# Patient Record
Sex: Female | Born: 1970
Health system: Southern US, Community
[De-identification: ages and names within clinical notes are randomized; demographics above are authoritative.]

## PROBLEM LIST (undated history)

## (undated) DIAGNOSIS — R51 Headache: Secondary | ICD-10-CM

## (undated) DIAGNOSIS — G8929 Other chronic pain: Secondary | ICD-10-CM

## (undated) DIAGNOSIS — T7840XA Allergy, unspecified, initial encounter: Secondary | ICD-10-CM

## (undated) DIAGNOSIS — M1990 Unspecified osteoarthritis, unspecified site: Secondary | ICD-10-CM

## (undated) DIAGNOSIS — R519 Headache, unspecified: Secondary | ICD-10-CM

## (undated) HISTORY — DX: Headache: R51

## (undated) HISTORY — PX: MYOMECTOMY: SHX85

## (undated) HISTORY — DX: Allergy, unspecified, initial encounter: T78.40XA

## (undated) HISTORY — DX: Other chronic pain: G89.29

## (undated) HISTORY — DX: Headache, unspecified: R51.9

## (undated) HISTORY — DX: Unspecified osteoarthritis, unspecified site: M19.90

---

## 2000-05-15 ENCOUNTER — Other Ambulatory Visit: Admission: RE | Admit: 2000-05-15 | Discharge: 2000-05-15 | Payer: Self-pay | Admitting: Obstetrics and Gynecology

## 2003-06-09 ENCOUNTER — Ambulatory Visit (HOSPITAL_COMMUNITY): Admission: RE | Admit: 2003-06-09 | Discharge: 2003-06-09 | Payer: Self-pay | Admitting: Family Medicine

## 2003-07-28 ENCOUNTER — Other Ambulatory Visit: Admission: RE | Admit: 2003-07-28 | Discharge: 2003-07-28 | Payer: Self-pay | Admitting: Obstetrics and Gynecology

## 2004-07-28 ENCOUNTER — Other Ambulatory Visit: Admission: RE | Admit: 2004-07-28 | Discharge: 2004-07-28 | Payer: Self-pay | Admitting: Obstetrics and Gynecology

## 2004-08-24 ENCOUNTER — Ambulatory Visit (HOSPITAL_COMMUNITY): Admission: RE | Admit: 2004-08-24 | Discharge: 2004-08-24 | Payer: Self-pay | Admitting: Obstetrics and Gynecology

## 2007-02-07 ENCOUNTER — Encounter (INDEPENDENT_AMBULATORY_CARE_PROVIDER_SITE_OTHER): Payer: Self-pay | Admitting: Obstetrics and Gynecology

## 2007-02-07 ENCOUNTER — Inpatient Hospital Stay (HOSPITAL_COMMUNITY): Admission: RE | Admit: 2007-02-07 | Discharge: 2007-02-09 | Payer: Self-pay | Admitting: Obstetrics and Gynecology

## 2008-12-16 ENCOUNTER — Ambulatory Visit (HOSPITAL_COMMUNITY): Admission: RE | Admit: 2008-12-16 | Discharge: 2008-12-16 | Payer: Self-pay | Admitting: Obstetrics and Gynecology

## 2008-12-30 ENCOUNTER — Ambulatory Visit (HOSPITAL_COMMUNITY): Admission: RE | Admit: 2008-12-30 | Discharge: 2008-12-30 | Payer: Self-pay | Admitting: Obstetrics and Gynecology

## 2009-01-15 ENCOUNTER — Ambulatory Visit (HOSPITAL_COMMUNITY): Admission: RE | Admit: 2009-01-15 | Discharge: 2009-01-15 | Payer: Self-pay | Admitting: Obstetrics and Gynecology

## 2009-01-30 ENCOUNTER — Ambulatory Visit (HOSPITAL_COMMUNITY): Admission: RE | Admit: 2009-01-30 | Discharge: 2009-01-30 | Payer: Self-pay | Admitting: Obstetrics and Gynecology

## 2009-02-13 ENCOUNTER — Ambulatory Visit (HOSPITAL_COMMUNITY): Admission: RE | Admit: 2009-02-13 | Discharge: 2009-02-13 | Payer: Self-pay | Admitting: Obstetrics and Gynecology

## 2009-02-26 ENCOUNTER — Ambulatory Visit (HOSPITAL_COMMUNITY): Admission: RE | Admit: 2009-02-26 | Discharge: 2009-02-26 | Payer: Self-pay | Admitting: Obstetrics and Gynecology

## 2009-03-31 ENCOUNTER — Ambulatory Visit: Payer: Self-pay | Admitting: Obstetrics & Gynecology

## 2009-04-07 ENCOUNTER — Inpatient Hospital Stay (HOSPITAL_COMMUNITY): Admission: AD | Admit: 2009-04-07 | Discharge: 2009-04-10 | Payer: Self-pay | Admitting: Obstetrics and Gynecology

## 2009-04-13 ENCOUNTER — Ambulatory Visit (HOSPITAL_COMMUNITY): Admission: RE | Admit: 2009-04-13 | Discharge: 2009-04-13 | Payer: Self-pay | Admitting: Obstetrics and Gynecology

## 2009-04-14 ENCOUNTER — Ambulatory Visit: Payer: Self-pay | Admitting: Obstetrics & Gynecology

## 2009-04-17 ENCOUNTER — Ambulatory Visit: Payer: Self-pay | Admitting: Obstetrics & Gynecology

## 2009-04-21 ENCOUNTER — Ambulatory Visit: Payer: Self-pay | Admitting: Obstetrics & Gynecology

## 2009-04-23 ENCOUNTER — Ambulatory Visit: Payer: Self-pay | Admitting: Obstetrics & Gynecology

## 2009-04-28 ENCOUNTER — Encounter (INDEPENDENT_AMBULATORY_CARE_PROVIDER_SITE_OTHER): Payer: Self-pay | Admitting: Obstetrics and Gynecology

## 2009-04-28 ENCOUNTER — Ambulatory Visit: Payer: Self-pay | Admitting: Obstetrics & Gynecology

## 2009-04-28 ENCOUNTER — Inpatient Hospital Stay (HOSPITAL_COMMUNITY): Admission: AD | Admit: 2009-04-28 | Discharge: 2009-05-01 | Payer: Self-pay | Admitting: Obstetrics and Gynecology

## 2010-05-23 ENCOUNTER — Encounter: Payer: Self-pay | Admitting: Obstetrics and Gynecology

## 2010-08-02 LAB — COMPREHENSIVE METABOLIC PANEL
ALT: 13 U/L (ref 0–35)
AST: 22 U/L (ref 0–37)
AST: 34 U/L (ref 0–37)
Albumin: 2.1 g/dL — ABNORMAL LOW (ref 3.5–5.2)
Albumin: 2.7 g/dL — ABNORMAL LOW (ref 3.5–5.2)
BUN: 2 mg/dL — ABNORMAL LOW (ref 6–23)
Calcium: 7.3 mg/dL — ABNORMAL LOW (ref 8.4–10.5)
Calcium: 9.1 mg/dL (ref 8.4–10.5)
Chloride: 103 mEq/L (ref 96–112)
Chloride: 107 mEq/L (ref 96–112)
Creatinine, Ser: 0.7 mg/dL (ref 0.4–1.2)
Creatinine, Ser: 0.87 mg/dL (ref 0.4–1.2)
GFR calc Af Amer: >60 mL/min (ref >60)
GFR calc Af Amer: >60 mL/min (ref >60)
Sodium: 136 mEq/L (ref 135–145)
Total Bilirubin: 0.2 mg/dL — ABNORMAL LOW (ref 0.3–1.2)
Total Bilirubin: 0.7 mg/dL (ref 0.3–1.2)
Total Protein: 5 g/dL — ABNORMAL LOW (ref 6.0–8.3)

## 2010-08-02 LAB — DIFFERENTIAL
Basophils Absolute: 0 10*3/uL (ref 0.0–0.1)
Eosinophils Relative: 1 % (ref 0–5)
Lymphocytes Relative: 19 % (ref 12–46)
Lymphs Abs: 1.5 10*3/uL (ref 0.7–4.0)
Monocytes Absolute: 0.6 10*3/uL (ref 0.1–1.0)
Monocytes Relative: 8 % (ref 3–12)
Neutro Abs: 6 10*3/uL (ref 1.7–7.7)

## 2010-08-02 LAB — URINE CULTURE: Special Requests: NEGATIVE

## 2010-08-02 LAB — CBC
HCT: 36.8 % (ref 36.0–46.0)
Hemoglobin: 13.6 g/dL (ref 12.0–15.0)
MCHC: 33.2 g/dL (ref 30.0–36.0)
MCHC: 33.5 g/dL (ref 30.0–36.0)
MCV: 85.9 fL (ref 78.0–100.0)
MCV: 86 fL (ref 78.0–100.0)
MCV: 86.8 fL (ref 78.0–100.0)
Platelets: 145 10*3/uL — ABNORMAL LOW (ref 150–400)
Platelets: 154 10*3/uL (ref 150–400)
RBC: 4.53 MIL/uL (ref 3.87–5.11)
RBC: 4.73 MIL/uL (ref 3.87–5.11)
RDW: 14.9 % (ref 11.5–15.5)
RDW: 15.1 % (ref 11.5–15.5)
WBC: 5.2 10*3/uL (ref 4.0–10.5)
WBC: 8.2 10*3/uL (ref 4.0–10.5)

## 2010-08-02 LAB — URINALYSIS, ROUTINE W REFLEX MICROSCOPIC
Bilirubin Urine: NEGATIVE
Glucose, UA: NEGATIVE mg/dL
Ketones, ur: 15 mg/dL — AB
Protein, ur: NEGATIVE mg/dL

## 2010-08-02 LAB — URIC ACID: Uric Acid, Serum: 7.6 mg/dL — ABNORMAL HIGH (ref 2.4–7.0)

## 2010-08-03 LAB — CBC
HCT: 34 % — ABNORMAL LOW (ref 36.0–46.0)
Platelets: 201 10*3/uL (ref 150–400)
RDW: 14.4 % (ref 11.5–15.5)
WBC: 4.7 10*3/uL (ref 4.0–10.5)

## 2010-08-03 LAB — COMPREHENSIVE METABOLIC PANEL
ALT: 13 U/L (ref 0–35)
Albumin: 2.5 g/dL — ABNORMAL LOW (ref 3.5–5.2)
BUN: 7 mg/dL (ref 6–23)
Calcium: 8.9 mg/dL (ref 8.4–10.5)
Glucose, Bld: 91 mg/dL (ref 70–99)
Sodium: 137 mEq/L (ref 135–145)
Total Protein: 5.2 g/dL — ABNORMAL LOW (ref 6.0–8.3)

## 2010-08-03 LAB — RPR: RPR Ser Ql: NONREACTIVE

## 2010-08-03 LAB — LACTATE DEHYDROGENASE: LDH: 141 U/L (ref 94–250)

## 2010-08-03 LAB — URIC ACID: Uric Acid, Serum: 6.3 mg/dL (ref 2.4–7.0)

## 2010-08-03 LAB — STREP B DNA PROBE: Strep Group B Ag: NEGATIVE

## 2010-09-14 NOTE — Op Note (Signed)
Shirley Wallace, Shirley Wallace                  ACCOUNT NO.:  [id number]   MEDICAL RECORD NO.:  [id number]          PATIENT TYPE:  INP   LOCATION:  9319                          FACILITY:  [facility]   PHYSICIAN:  Maxie Better, M.D.DATE OF BIRTH:  Sep 06, 1970   DATE OF PROCEDURE:  02/07/2007  DATE OF DISCHARGE:                               OPERATIVE REPORT   PREOPERATIVE DIAGNOSIS:  Menorrhagia, fibroid uterus, recurrent  pregnancy loss.   PROCEDURES:  Exploratory laparotomy, multiple myomectomy.   POSTOPERATIVE DIAGNOSIS:  Recurrent pregnancy loss, fibroid uterus,  menorrhagia.   ANESTHESIA:  General.   SURGEON:  Maxie Better, M.D.   ASSISTANT:  Cordelia Pen A. Rosalio Macadamia, M.D.   INDICATIONS:  This is an 40 year old gravida 4, para 2-0-2-2 female with  symptomatic uterine fibroids and recurrent pregnancy loss and now  presents for surgical management.  Risk and benefits of procedure have  been explained to the patient and her family.  Consent was signed.  The  patient was transferred to the operating room.   PROCEDURE:  Under adequate general anesthesia, the patient is placed in  supine position.  She was sterilely prepped and draped in usual fashion.  An indwelling Foley catheter was sterilely placed.  0.25% Marcaine was  injected along the planned Pfannenstiel skin incision site.  Pfannenstiel skin incision was then made, carried down to the rectus  fascia.  Rectus fascia was opened transversely.  Rectus fascia was then  bluntly and sharply dissected off the rectus muscle in superior and  inferior fashion.  The rectus muscles split in midline.  The parietal  peritoneum was entered sharply and extended.  The uterus was  exteriorized.  Multiple fibroids were noted. One left anterior  pedunculated fibroid was noted, another several subserosal and  intramural fibroids was found.  There were some filmy adhesions along  the posterior aspect of the uterus and involving both fallopian tubes  proximally, both ovaries otherwise were normal.  The distal end of the  fallopian tubes appeared normal.  Using a dilute solution of Pitressin,  injections were made over the line of the fibroid to maximize the amount  of fibroid that could be removed with minimal incisions.  Both  posteriorly and anteriorly incisions were made with resultant removal of  13 fibroids, one of which was clearly submucosal which resulted in entry  into the endometrial cavity.  The defects from the removal of the  fibroids were closed with 0 Vicryl figure-of-eight sutures until serosal  surface was reached at which point either 2-0 Monocryl baseball stitch  was used, 3-0 Monocryl used to facilitate closures of all incision.  The  endometrial cavity was palpated.  No other fibroids were noted.  With  the removal of the fibroids, the abdomen was copiously irrigated and  suctioned.  The appendix was noted to be elongated but otherwise normal.  The upper abdomen was not palpated to minimize the manipulation of the  abdomen and decrease adhesion formation.  The uterus was returned to the  abdomen.  Interceed was placed anteriorly and posteriorly overlying the  incisions.  Good hemostasis was  noted.  The parietal peritoneum was not  closed.  The rectus fascia was closed with 0 Vicryl x2.  The  subcutaneous areas irrigated, small bleeders cauterized and interrupted  2-0 plain suture was used in the subcutaneous space.  The skin  approximated using Ethicon staples.  Specimens were myoma x 13 sent to  pathology.  Estimated blood loss was less than 100 mL.  Intraoperative  fluid was 2300 mL crystalloid.  Urine output was 450 mL clear yellow  urine.  Sponge and instrument counts x2 was correct.   COMPLICATIONS:  None.  The endometrial cavity is entered and therefore  this patient will need cesarean section for future deliveries.  The  patient tolerated the procedure well, was transferred to recovery in  stable  condition.      Maxie Better, M.D.  Electronically Signed     [place]/MEDQ  D:  02/07/2007  T:  02/07/2007  Job:  [id number]

## 2010-09-17 NOTE — Discharge Summary (Signed)
Shirley Wallace, Shirley Wallace                  ACCOUNT NO.:  [id number]   MEDICAL RECORD NO.:  [id number]          PATIENT TYPE:  INP   LOCATION:  9319                          FACILITY:  [facility]   PHYSICIAN:  Maxie Better, M.D.DATE OF BIRTH:  25-Sep-1970   DATE OF ADMISSION:  02/07/2007  DATE OF DISCHARGE:  02/09/2007                               DISCHARGE SUMMARY   ADMISSION DIAGNOSES:  1. Recurrent pregnancy loss.  2. Menorrhagia.  3. Uterine fibroids.   DISCHARGE DIAGNOSES:  1. Recurrent pregnancy loss.  2. Menorrhagia.  3. Uterine fibroids.   PROCEDURE:  Exploratory laparotomy, multiple myomectomies.   HISTORY OF PRESENT ILLNESS:  A 40 year old, gravida 4 para 0-0-4-0  female with symptomatic uterine fibroids and recurrent pregnancy loss  who now presents for surgical management.   HOSPITAL COURSE:  The patient was admitted to [facility].  She  underwent exploratory laparotomy and removal of 13 myomas.  She had  normal tubes with some paratubal adhesions which were lysed, normal  ovaries, normal appendix.  Endometrial cavity was entered and therefore  this patient will require cesarean section for future delivery.  Her  postoperative course was unremarkable.  Her CBC on postop day #1 was  10.2, hematocrit 30, white count of 14.7, platelet count of 365,000.  She was tolerating a regular diet, had passed flatus.  Her incision had  no evidence of infection.  She was deemed well to be discharged.  Her  pathology revealed 446 grams of fibroids.   DISPOSITION:  Home.   CONDITION:  Stable.   DISCHARGE MEDICATIONS:  1. Darvocet-N 100 one to two tablets q.3-4 hours pain.  2. Continue her iron supplementation one p.o. daily.  3. Multivitamins one p.o. daily.   FOLLOWUP APPOINTMENT:  [facility] for staple removal on Monday and  6 weeks postop.   DISCHARGE INSTRUCTIONS:  Call for temperature greater than or equal to  100.4.  Nothing per vagina for 4 to 6 weeks.  No heavy  lifting or  driving for 2 weeks.  No straining with bowel movement.  May shower.  Call for incisional drainage, redness or increased pain from the  incision site, soaking a regular pad every hour or more frequently.      Maxie Better, M.D.  Electronically Signed     [place]/MEDQ  D:  03/27/2007  T:  03/27/2007  Job:  [id number]

## 2011-02-10 LAB — CBC
MCHC: 33.8
Platelets: 365
Platelets: 429 — ABNORMAL HIGH
RDW: 13.7
WBC: 14.7 — ABNORMAL HIGH

## 2011-02-10 LAB — HCG, SERUM, QUALITATIVE: Preg, Serum: NEGATIVE

## 2012-10-15 ENCOUNTER — Other Ambulatory Visit: Payer: Self-pay | Admitting: Internal Medicine

## 2012-10-15 DIAGNOSIS — Z1231 Encounter for screening mammogram for malignant neoplasm of breast: Secondary | ICD-10-CM

## 2012-11-14 ENCOUNTER — Ambulatory Visit: Payer: Self-pay

## 2012-12-04 ENCOUNTER — Ambulatory Visit: Payer: Self-pay

## 2013-03-27 ENCOUNTER — Other Ambulatory Visit (HOSPITAL_COMMUNITY): Payer: Self-pay | Admitting: Internal Medicine

## 2013-03-27 DIAGNOSIS — R1031 Right lower quadrant pain: Secondary | ICD-10-CM

## 2013-03-29 ENCOUNTER — Ambulatory Visit (HOSPITAL_COMMUNITY)
Admission: RE | Admit: 2013-03-29 | Discharge: 2013-03-29 | Disposition: A | Discharge status: Home / Self Care | Payer: 59 | Source: Ambulatory Visit | Attending: Internal Medicine | Admitting: Internal Medicine

## 2013-03-29 DIAGNOSIS — D259 Leiomyoma of uterus, unspecified: Secondary | ICD-10-CM | POA: Insufficient documentation

## 2013-03-29 DIAGNOSIS — N281 Cyst of kidney, acquired: Secondary | ICD-10-CM | POA: Insufficient documentation

## 2013-03-29 DIAGNOSIS — R1031 Right lower quadrant pain: Secondary | ICD-10-CM

## 2013-03-29 DIAGNOSIS — I998 Other disorder of circulatory system: Secondary | ICD-10-CM | POA: Insufficient documentation

## 2013-03-29 MED ORDER — IOHEXOL 300 MG/ML  SOLN
100.0000 mL | Freq: Once | INTRAMUSCULAR | Status: AC | PRN
  Administered 2013-03-29: 100 mL via INTRAVENOUS

## 2013-04-01 ENCOUNTER — Emergency Department (HOSPITAL_COMMUNITY)
Admission: EM | Admit: 2013-04-01 | Discharge: 2013-04-01 | Disposition: A | Discharge status: Home / Self Care | Payer: 59 | Attending: Emergency Medicine | Admitting: Emergency Medicine

## 2013-04-01 ENCOUNTER — Encounter (HOSPITAL_COMMUNITY): Payer: Self-pay | Admitting: Emergency Medicine

## 2013-04-01 DIAGNOSIS — Z79899 Other long term (current) drug therapy: Secondary | ICD-10-CM | POA: Insufficient documentation

## 2013-04-01 DIAGNOSIS — M62838 Other muscle spasm: Secondary | ICD-10-CM

## 2013-04-01 DIAGNOSIS — G243 Spasmodic torticollis: Secondary | ICD-10-CM

## 2013-04-01 MED ORDER — CYCLOBENZAPRINE HCL 5 MG PO TABS: 5.0000 mg | ORAL_TABLET | Freq: Three times a day (TID) | ORAL | Status: DC | PRN

## 2013-04-01 MED ORDER — IBUPROFEN 800 MG PO TABS
800.0000 mg | ORAL_TABLET | Freq: Once | ORAL | Status: AC
  Administered 2013-04-01: 800 mg via ORAL
  Filled 2013-04-01: qty 1

## 2013-04-01 MED ORDER — HYDROCODONE-ACETAMINOPHEN 5-325 MG PO TABS: ORAL_TABLET | ORAL | Status: DC

## 2013-04-01 NOTE — ED Provider Notes (Signed)
CSN: [id number]     Arrival date & time 04/01/13  0515 History   First MD Initiated Contact with Patient 04/01/13 0531     Chief Complaint  Patient presents with  . Neck Pain   (Consider location/radiation/quality/duration/timing/severity/associated sxs/prior Treatment) Patient is a 42 y.o. female presenting with neck pain. The history is provided by the patient.  Neck Pain Pain location:  L side Quality:  Stiffness and aching Stiffness is present:  All day Pain radiates to:  L arm Pain severity:  Moderate Pain is:  Same all the time Onset quality:  Gradual Duration:  2 days Timing:  Constant Progression:  Worsening Chronicity:  New Context: not recent injury   Context comment:  Pt woke up wtih stiffness, slept in wrong position Relieved by:  Neck support Worsened by:  Bending, position and twisting Ineffective treatments:  NSAIDs Associated symptoms: numbness   Associated symptoms: no chest pain, no fever, no headaches, no tingling and no weakness   Risk factors: no hx of spinal trauma and no recent head injury     History reviewed. No pertinent past medical history. Past Surgical History  Procedure Laterality Date  . Myomectomy    . Cesarean section     History reviewed. No pertinent family history. History  Substance Use Topics  . Smoking status: Never Smoker   . Smokeless tobacco: Not on file  . Alcohol Use: 1.2 oz/week    2 Shots of liquor per week   OB History   Grav Para Term Preterm Abortions TAB SAB Ect Mult Living                 Review of Systems  Constitutional: Negative for fever and chills.  Respiratory: Negative for shortness of breath.   Cardiovascular: Negative for chest pain.  Gastrointestinal: Negative for abdominal pain.  Musculoskeletal: Positive for neck pain and neck stiffness. Negative for back pain.  Neurological: Positive for numbness. Negative for tingling, weakness and headaches.    Allergies  Aspirin  Home Medications    Current Outpatient Rx  Name  Route  Sig  Dispense  Refill  . Multiple Vitamin (MULTIVITAMIN WITH MINERALS) TABS tablet   Oral   Take 1 tablet by mouth every morning.         . naproxen sodium (ANAPROX) 220 MG tablet   Oral   Take 220 mg by mouth 2 (two) times daily as needed (pain).         . cyclobenzaprine (FLEXERIL) 5 MG tablet   Oral   Take 1 tablet (5 mg total) by mouth 3 (three) times daily as needed for muscle spasms.   20 tablet   0   . HYDROcodone-acetaminophen (NORCO/VICODIN) 5-325 MG per tablet      1-2 tablets po q 6 hours prn moderate to severe pain   20 tablet   0   . PRESCRIPTION MEDICATION   Oral   Take 2 tablets by mouth 2 (two) times daily.          BP 164/96  Pulse 80  Temp(Src) 98.4 F (36.9 C) (Oral)  Resp 20  SpO2 100%  LMP 02/15/2013 Physical Exam  Nursing note and vitals reviewed. Constitutional: She is oriented to person, place, and time. She appears well-developed and well-nourished. No distress.  Neck: Muscular tenderness present. No rigidity. Decreased range of motion present.    Muscle tension and visible muscle spasm to left paraspinal region and upper trapezius   Pulmonary/Chest: No stridor.  Abdominal: Soft.  Musculoskeletal:       Left shoulder: She exhibits normal range of motion.       Cervical back: She exhibits decreased range of motion, tenderness, pain and spasm. She exhibits no bony tenderness.       Back:  Neurological: She is alert and oriented to person, place, and time. She displays no atrophy. No sensory deficit. She exhibits normal muscle tone. Gait normal.  Equal, good grip strength  Skin: Skin is warm.    ED Course  Procedures (including critical care time) Labs Review Labs Reviewed - No data to display Imaging Review No results found.  EKG Interpretation   None      ra sat is 100% and I interpret to be normal  MDM   1. Neck muscle spasm   2. Torticollis, spasmodic      Pt with cervical  and upper posterior shoulder muscle spasms, likely all stemming from cervical strain from sleeping prolonged time in poor position.  Will need continued NSAIDs, heat pack, will give Rx for pain and muscle relaxant to take at night, pt doesn't desire time off from work.      Gavin Pound. Oletta Lamas, MD 04/01/13 [phone]

## 2013-04-01 NOTE — ED Notes (Signed)
Pt arrived to the ED with a complaint of neck pain.  Pt states that the pain began on Friday on the left lower portion of her neck  Pt though at the time that she had slept wrong. Pt used ibuprofen and massage without relief.  Pt states that last night she was unable to sleep as the pain had radiated to her shoulder and had left her left arm intermittely numb

## 2013-04-01 NOTE — Discharge Instructions (Signed)
 Heat Therapy Heat therapy can help ease achy, tense, stiff, and tight muscles and joints. Heat should not be used on new injuries. Wait at least 48 hours after the injury before using heat therapy. Heat also should not be used for discomfort or pain that occurs right after doing an activity. If you still have pain or stiffness 3 hours after finishing the activity, then heat therapy may be used. PRECAUTIONS  High heat or prolonged exposure to heat can cause burns. Be careful when using heat therapy to avoid burning your skin. If you have any of the following conditions, do not use heat until you have discussed heat therapy with your caregiver:  Poor circulation.  Healing wounds or scarred skin in the area being treated.  Diabetes, heart disease, or high blood pressure.  Numbness of the area being treated.  Unusual swelling of the area being treated.  Active infections.  Blood clots.  Cancer.  Inability to communicate your response to pain. This can include young children and people with dementia. HOME CARE INSTRUCTIONS Moist heat pack  Soak a clean towel in warm water, and squeeze out the extra water. The water temperature should be comfortable to the skin.  Put the warm, wet towel in a plastic bag.  Place a thin, dry towel between your skin and the bag.  Put the heat pack on the area for 5 minutes, and check your skin. Your skin may be pink, but it should not be red.  Leave the heat pack on the area for a total of 15 to 30 minutes.  Repeat this every 2 to 4 hours while awake. Do not use heat while you are sleeping. Warm water bath  Fill a tub with warm water. The water temperature should be comfortable to the skin.  Place the affected body part in the tub.  Soak the area for 20 to 40 minutes.  Repeat as needed. Hot water bottle  Fill the water bottle half full with hot water.  Press out the extra air. Close the cap tightly.  Place a dry towel between your skin and  the bottle.  Put the bottle on the area for 5 minutes, and check your skin. Your skin may be pink, but it should not be red.  Leave the bottle on the area for a total of 15 to 30 minutes.  Repeat this every 2 to 4 hours while awake. Electric heating pad  Place a dry towel between your skin and the heating pad.  Set the heating pad on low heat.  Put the heating pad on the area for 10 minutes, and check your skin. Your skin may be pink, but it should not be red.  Leave the heating pad on the area for a total of 20 to 40 minutes.  Repeat this every 2 to 4 hours while awake.  Do not lie on the heating pad.  Do not fall asleep while using the heating pad.  Do not use the heating pad near water. Contact with water can result in an electrical shock. SEEK MEDICAL CARE IF:  You have blisters, redness, swelling, or numbness.  You have any new problems.  Your problems are getting worse.  You have any questions or concerns. If you develop any problems, stop using heat therapy until you see your caregiver. MAKE SURE YOU:  Understand these instructions.  Will watch your condition.  Will get help right away if you are not doing well or get worse. Document Released: 07/11/2011  Document Reviewed: 07/11/2011 [facility] Patient Information 2014 [place], [place].      Torticollis, Acute You have suddenly (acutely) developed a twisted neck (torticollis). This is usually a self-limited condition. CAUSES  Acute torticollis may be caused by malposition, trauma or infection. Most commonly, acute torticollis is caused by sleeping in an awkward position. Torticollis may also be caused by the flexion, extension or twisting of the neck muscles beyond their normal position. Sometimes, the exact cause may not be known. SYMPTOMS  Usually, there is pain and limited movement of the neck. Your neck may twist to one side. DIAGNOSIS  The diagnosis is often made by physical examination. X-rays, CT  scans or MRIs may be done if there is a history of trauma or concern of infection. TREATMENT  For a common, stiff neck that develops during sleep, treatment is focused on relaxing the contracted neck muscle. Medications (including shots) may be used to treat the problem. Most cases resolve in several days. Torticollis usually responds to conservative physical therapy. If left untreated, the shortened and spastic neck muscle can cause deformities in the face and neck. Rarely, surgery is required. HOME CARE INSTRUCTIONS   Use over-the-counter and prescription medications as directed by your caregiver.  Do stretching exercises and massage the neck as directed by your caregiver.  Follow up with physical therapy if needed and as directed by your caregiver. SEEK IMMEDIATE MEDICAL CARE IF:   You develop difficulty breathing or noisy breathing (stridor).  You drool, develop trouble swallowing or have pain with swallowing.  You develop numbness or weakness in the hands or feet.  You have changes in speech or vision.  You have problems with urination or bowel movements.  You have difficulty walking.  You have a fever.  You have increased pain. MAKE SURE YOU:   Understand these instructions.  Will watch your condition.  Will get help right away if you are not doing well or get worse. Document Released: 04/15/2000 Document Revised: 07/11/2011 Document Reviewed: 05/27/2009 [facility] Patient Information 2014 [place], [place].    Narcotic and benzodiazepine use may cause drowsiness, slowed breathing or dependence.  Please use with caution and do not drive, operate machinery or watch young children alone while taking them.  Taking combinations of these medications or drinking alcohol will potentiate these effects.

## 2013-04-02 ENCOUNTER — Encounter (HOSPITAL_COMMUNITY): Payer: Self-pay | Admitting: Emergency Medicine

## 2013-04-02 ENCOUNTER — Emergency Department (HOSPITAL_COMMUNITY)
Admission: EM | Admit: 2013-04-02 | Discharge: 2013-04-02 | Disposition: A | Discharge status: Home / Self Care | Payer: 59 | Attending: Emergency Medicine | Admitting: Emergency Medicine

## 2013-04-02 ENCOUNTER — Emergency Department (HOSPITAL_COMMUNITY): Payer: 59

## 2013-04-02 DIAGNOSIS — M436 Torticollis: Secondary | ICD-10-CM | POA: Insufficient documentation

## 2013-04-02 DIAGNOSIS — M79609 Pain in unspecified limb: Secondary | ICD-10-CM | POA: Insufficient documentation

## 2013-04-02 DIAGNOSIS — M62838 Other muscle spasm: Secondary | ICD-10-CM

## 2013-04-02 DIAGNOSIS — G243 Spasmodic torticollis: Secondary | ICD-10-CM

## 2013-04-02 MED ORDER — OXYCODONE-ACETAMINOPHEN 5-325 MG PO TABS: 1.0000 | ORAL_TABLET | Freq: Once | ORAL | Status: DC

## 2013-04-02 MED ORDER — KETOROLAC TROMETHAMINE 30 MG/ML IJ SOLN
30.0000 mg | Freq: Once | INTRAMUSCULAR | Status: AC
  Administered 2013-04-02: 30 mg via INTRAVENOUS
  Filled 2013-04-02: qty 1

## 2013-04-02 MED ORDER — OXYCODONE-ACETAMINOPHEN 5-325 MG PO TABS: 1.0000 | ORAL_TABLET | Freq: Four times a day (QID) | ORAL | Status: DC | PRN

## 2013-04-02 NOTE — ED Notes (Signed)
Pt states on Friday her left shoulder from neck down to hand started hurting. She was seen here on Mon and given a muscle re,laxant but that is not helping. Now states the pani is in the back of her shoulder as well.

## 2013-04-02 NOTE — ED Provider Notes (Signed)
CSN: [id number]     Arrival date & time 04/02/13  1207 History   First MD Initiated Contact with Patient 04/02/13 1230   This chart was scribed for non-physician practitioner Francee Piccolo, PA-C working with Raeford Razor, MD by Valera Castle, ED scribe. This patient was seen in room WTR7/WTR7 and the patient's care was started at 1:02 PM.   Chief Complaint  Patient presents with  . Arm Pain    left   . Shoulder Pain    left   The history is provided by the patient. No language interpreter was used.   HPI Comments: Shirley Wallace is a 42 y.o. female who presents to the Emergency Department complaining of gradual, constant, left sided neck pain, with a 10/10 severity, that radiates from her left neck to left shoulder, onset 4 days ago when she woke up, stating she felt like she "slept wrong." She reports associated muscle tightness. She reports receiving a muscle relaxer after being seen here yesterday, but denies any relief despite taking two doses. She denies any fever, recent falls, headache, chest pain, and any other associated symptoms. She denies any new injury since onset of her symptoms or between her ED visit yesterday and today.  PCP - Lesly Dukes., MD  History reviewed. No pertinent past medical history. Past Surgical History  Procedure Laterality Date  . Myomectomy    . Cesarean section     No family history on file. History  Substance Use Topics  . Smoking status: Never Smoker   . Smokeless tobacco: Not on file  . Alcohol Use: 1.2 oz/week    2 Shots of liquor per week   OB History   Grav Para Term Preterm Abortions TAB SAB Ect Mult Living                 Review of Systems  Constitutional: Negative for fever.  Respiratory: Negative for chest tightness.   Cardiovascular: Negative for chest pain.  Musculoskeletal: Positive for arthralgias (left shoulder to left hand) and neck pain.  Neurological: Negative for headaches.  All other systems reviewed and are  negative.   Allergies  Aspirin  Home Medications   Current Outpatient Rx  Name  Route  Sig  Dispense  Refill  . cyclobenzaprine (FLEXERIL) 5 MG tablet   Oral   Take 1 tablet (5 mg total) by mouth 3 (three) times daily as needed for muscle spasms.   20 tablet   0   . HYDROcodone-acetaminophen (NORCO/VICODIN) 5-325 MG per tablet      1-2 tablets po q 6 hours prn moderate to severe pain   20 tablet   0   . naproxen sodium (ANAPROX) 220 MG tablet   Oral   Take 220 mg by mouth 2 (two) times daily as needed (pain).         [name] oxyCODONE-acetaminophen (PERCOCET/ROXICET) 5-325 MG per tablet   Oral   Take 1-2 tablets by mouth every 6 (six) hours as needed for severe pain.   30 tablet   0    BP 159/91  Pulse 80  Temp(Src) 98.6 F (37 C) (Oral)  Resp 20  SpO2 100%  LMP 02/15/2013  Physical Exam  Nursing note and vitals reviewed. Constitutional: She is oriented to person, place, and time. She appears well-developed and well-nourished. No distress.  HENT:  Head: Normocephalic and atraumatic.  Right Ear: External ear normal.  Left Ear: External ear normal.  Nose: Nose normal.  Mouth/Throat: Oropharynx is clear and  moist.  Eyes: Conjunctivae are normal.  Neck: Normal range of motion. Neck supple. Muscular tenderness present. No spinous process tenderness present. No rigidity. No edema, no erythema and normal range of motion present.    Cardiovascular: Normal rate and intact distal pulses.   Pulmonary/Chest: Effort normal.  Abdominal: Soft.  Musculoskeletal: Normal range of motion.       Right shoulder: Normal.       Left shoulder: Normal.  Neurological: She is alert and oriented to person, place, and time.  Skin: Skin is warm and dry. She is not diaphoretic.  Psychiatric: She has a normal mood and affect.    ED Course  Procedures (including critical care time) Medications  ketorolac (TORADOL) 30 MG/ML injection 30 mg (30 mg Intravenous Given 04/02/13 1331)      DIAGNOSTIC STUDIES: Oxygen Saturation is 100% on room air, normal by my interpretation.    COORDINATION OF CARE: 1:07 PM-Discussed treatment plan which includes clinical doubt of heart issue with pt at bedside and pt agreed to plan. Pt is adamant about having X-ray done here today.  Labs Review Labs Reviewed - No data to display Imaging Review Dg Cervical Spine Complete  04/02/2013   CLINICAL DATA:  Pain on the right side of the neck  EXAM: CERVICAL SPINE  4+ VIEWS  COMPARISON:  None.  FINDINGS: The cervical spine is visualized to the level of C6-7.  The vertebral body heights are maintained. The alignment is normal. There is loss of the normal cervical lordosis with reversal which may be secondary to spasm. The prevertebral soft tissues are normal. There is no acute fracture or static listhesis. There is mild degenerative disc disease at C5-6 and C6-7.  IMPRESSION: No acute osseous injury of the cervical spine.   Electronically Signed   By: Elige Ko   On: 04/02/2013 14:10    EKG Interpretation   None       MDM   1. Torticollis, spasmodic   2. Muscle spasms of neck    Afebrile, NAD, non-toxic appearing, AAOx4. Neurovascularly intact. Normal sensation. No spinous process tenderness. Pt adamant about receiving x-ray for continued pain. Reviewed imaging. Continued pain d/t spasmodic torticollis. Treated pain here. Advised patient to take a few days off of work to allowed medications to work. Pain and symptoms improved with treatment in ED. Return precautions disucssed. Patient is agreeable to plan. Patient is stable at time of discharge       I personally performed the services described in this documentation, which was scribed in my presence. The recorded information has been reviewed and is accurate.     Jeannetta Ellis, PA-C 04/02/13 1444

## 2013-04-03 NOTE — ED Provider Notes (Signed)
Medical screening examination/treatment/procedure(s) were performed by non-physician practitioner and as supervising physician I was immediately available for consultation/collaboration.  EKG Interpretation   None        Yazen Rosko, MD 04/03/13 1758 

## 2013-06-17 ENCOUNTER — Ambulatory Visit: Payer: 59

## 2013-07-02 ENCOUNTER — Ambulatory Visit: Payer: 59

## 2013-07-05 ENCOUNTER — Ambulatory Visit: Payer: 59

## 2013-07-08 ENCOUNTER — Ambulatory Visit
Admission: RE | Admit: 2013-07-08 | Discharge: 2013-07-08 | Disposition: A | Discharge status: Home / Self Care | Payer: 59 | Source: Ambulatory Visit | Attending: Internal Medicine | Admitting: Internal Medicine

## 2013-07-08 ENCOUNTER — Other Ambulatory Visit: Payer: Self-pay | Admitting: Internal Medicine

## 2013-07-08 DIAGNOSIS — Z1231 Encounter for screening mammogram for malignant neoplasm of breast: Secondary | ICD-10-CM

## 2013-12-10 ENCOUNTER — Ambulatory Visit (INDEPENDENT_AMBULATORY_CARE_PROVIDER_SITE_OTHER): Payer: 59 | Admitting: Family Medicine

## 2013-12-10 ENCOUNTER — Encounter: Payer: Self-pay | Admitting: Family Medicine

## 2013-12-10 ENCOUNTER — Other Ambulatory Visit (INDEPENDENT_AMBULATORY_CARE_PROVIDER_SITE_OTHER): Payer: 59

## 2013-12-10 ENCOUNTER — Ambulatory Visit (INDEPENDENT_AMBULATORY_CARE_PROVIDER_SITE_OTHER)
Admission: RE | Admit: 2013-12-10 | Discharge: 2013-12-10 | Disposition: A | Discharge status: Home / Self Care | Payer: 59 | Source: Ambulatory Visit | Attending: Family Medicine | Admitting: Family Medicine

## 2013-12-10 VITALS — BP 110/73 | HR 84 | Ht 62.0 in | Wt 169.0 lb

## 2013-12-10 DIAGNOSIS — M25569 Pain in unspecified knee: Secondary | ICD-10-CM

## 2013-12-10 DIAGNOSIS — M25561 Pain in right knee: Secondary | ICD-10-CM

## 2013-12-10 DIAGNOSIS — M25069 Hemarthrosis, unspecified knee: Secondary | ICD-10-CM

## 2013-12-10 DIAGNOSIS — M25061 Hemarthrosis, right knee: Secondary | ICD-10-CM

## 2013-12-10 MED ORDER — MELOXICAM 15 MG PO TABS: 15.0000 mg | ORAL_TABLET | Freq: Every day | ORAL | Status: DC

## 2013-12-10 NOTE — Patient Instructions (Addendum)
You did have bleeding in the knee and may have injured the ligament.  Ice 20 minutes 2 times daily. Usually after activity and before bed. Hold on exercises for next week.  Meloxicam daily for 10 days Wear the brace daily but not at night Touch base on Friday.

## 2013-12-10 NOTE — Progress Notes (Signed)
[facility] [address] [place], [place] [address] Phone: [phone] Subjective:     CC: Right knee pain and swelling  FBP:Shirley Wallace KHRYSTINA BONNES is a 43 y.o. female coming in with complaint of right knee pain and swelling. Patient states 4 days ago she was squatting to get something on the floor and when she came back up she had a loud popping sensation. Patient states that in started having a dull aching pain on the medial aspect of her knee. Patient states it seemed it worse over the course of 24 hours and patient was not able to ambulate this weekend. Monday she was able to walk but continued to have some discomfort. Today the pain has also improved again but continues to have discomfort. Patient does notice that she is unable to extend the knee completely or flex the knee completely. States that with walking he can feel unstable. Can wake her up at night. Didn't have a sharp pain with certain movements. Severity it is now 5/10 but was as severe as 9/10 previously. Patient does not remember any true injury prior to this event.     Past medical history, social, surgical and family history all reviewed in electronic medical record.   Review of Systems: No headache, visual changes, nausea, vomiting, diarrhea, constipation, dizziness, abdominal pain, skin rash, fevers, chills, night sweats, weight loss, swollen lymph nodes, body aches, joint swelling, muscle aches, chest pain, shortness of breath, mood changes.   Objective Blood pressure 110/73, pulse 84, height 5\' 2"  (1.575 m), weight 169 lb (76.658 kg).  General: No apparent distress alert and oriented x3 mood and affect normal, dressed appropriately.  HEENT: Pupils equal, extraocular movements intact  Respiratory: Patient's speak in full sentences and does not appear short of breath  Cardiovascular: No lower extremity edema, non tender, no erythema  Skin: Warm dry intact with no signs of infection or rash  on extremities or on axial skeleton.  Abdomen: Soft nontender  Neuro: Cranial nerves II through XII are intact, neurovascularly intact in all extremities with 2+ DTRs and 2+ pulses.  Lymph: No lymphadenopathy of posterior or anterior cervical chain or axillae bilaterally.  Gait normal with good balance and coordination.  MSK:  Non tender with full range of motion and good stability and symmetric strength and tone of shoulders, elbows, wrist, hip and ankles bilaterally.  Knee: Right Patient does have some swelling of the knee with a +1 effusion compared to the contralateral side Moderate tenderness to palpation over the medial joint line Range of motion is decreased with lacking the last 5 of extension and the last 15 of flexion. Ligament testing it appears that patient's PCL is lax. There is a questionable laxity of the anterior cruciate ligament compared to the contralateral side. MCL and LCL appears to be intact Positive Mcmurray's, Apley's, and Thessalonian tests. Non painful patellar compression. Patellar glide without crepitus. Patellar and quadriceps tendons unremarkable. Hamstring and quadriceps strength is normal.  Contralateral knee unremarkable  MSK US performed of: Right knee This study was ordered, performed, and interpreted by Charlann Boxer D.O.  Knee: All structures visualized. Anteromedial, anterolateral, posteromedial, and posterolateral menisci unremarkable without tearing, fraying, effusion, or displacement. Patient does have a very large effusion of the suprapatellar pouch Patellar Tendon unremarkable on long and transverse views without effusion. No abnormality of prepatellar bursa. LCL and MCL unremarkable on long and transverse views. No abnormality of origin of medial or lateral head of the gastrocnemius.  IMPRESSION:  Knee effusion with no significant bony abnormality and no true tear appreciated of the meniscus.  Procedure Procedure: Real-time Ultrasound  Guided aspiration and Injection of right knee Device: GE Logiq E  Ultrasound guided injection is preferred based studies that show increased duration, increased effect, greater accuracy, decreased procedural pain, increased response rate, and decreased cost with ultrasound guided versus blind injection.  Verbal informed consent obtained.  Time-out conducted.  Noted no overlying erythema, induration, or other signs of local infection.  Skin prepped in a sterile fashion.  Local anesthesia: Topical Ethyl chloride.  With sterile technique and under real time ultrasound guidance: With a 22-gauge 2 inch needle patient was injected with 4 cc of 0.5% Marcaine and 1 cc of Kenalog 40 mg/dL. This was from a superior lateral approach. These for injection patient was drained 20 cc of frank blood Completed without difficulty  Pain immediately resolved suggesting accurate placement of the medication.  Advised to call if fevers/chills, erythema, induration, drainage, or persistent bleeding.  Images permanently stored and available for review in the ultrasound unit.  Impression: Technically successful ultrasound guided aspiration and injection. .     Impression and Recommendations:     This case required medical decision making of moderate complexity.

## 2013-12-10 NOTE — Assessment & Plan Note (Signed)
Patient did have a hemarthrosis of the right knee. This could be secondary to a ligamentous injury but difficult to tell for sure secondary to the amount of swelling that was there on exam. Patient was given a brace today, icing protocol and will do anti-inflammatories for the next 10 days. We discussed the patient has more instability over the course of the next 3 days I would consider further imaging. X-rays are pending and I would likely need an MRI for further evaluation of the anterior cruciate ligament and PCL. Patient will follow up in 10 days for further evaluation and treatment.

## 2014-02-03 ENCOUNTER — Other Ambulatory Visit: Payer: Self-pay | Admitting: Family Medicine

## 2014-02-03 NOTE — Telephone Encounter (Signed)
Refill done.  

## 2014-06-05 ENCOUNTER — Other Ambulatory Visit: Payer: Self-pay

## 2014-06-05 DIAGNOSIS — Z1231 Encounter for screening mammogram for malignant neoplasm of breast: Secondary | ICD-10-CM

## 2014-07-10 ENCOUNTER — Encounter (INDEPENDENT_AMBULATORY_CARE_PROVIDER_SITE_OTHER): Payer: Self-pay

## 2014-07-10 ENCOUNTER — Ambulatory Visit
Admission: RE | Admit: 2014-07-10 | Discharge: 2014-07-10 | Disposition: A | Discharge status: Home / Self Care | Payer: 59 | Source: Ambulatory Visit

## 2014-07-10 ENCOUNTER — Other Ambulatory Visit: Payer: Self-pay

## 2014-07-10 DIAGNOSIS — Z1231 Encounter for screening mammogram for malignant neoplasm of breast: Secondary | ICD-10-CM

## 2014-08-07 ENCOUNTER — Telehealth: Payer: Self-pay | Admitting: Internal Medicine

## 2014-08-07 NOTE — Telephone Encounter (Signed)
Spoke with pt. Advised her that I would get this message to CY to see if he would be willing to work her sooner than his next available for an allergy consults. She verbalized understanding.  CY - please advise. Thanks.

## 2014-08-12 NOTE — Telephone Encounter (Signed)
I called lab downstairs; pt is not at that location today;she can be reached at [phone]. We can add patient to schedule for allergy consult on Friday 08-22-14 at 11:15am.   I called the above number and was unable to reach patient. Triage will need to try again later today. Thanks.

## 2014-08-12 NOTE — Telephone Encounter (Signed)
Dr. Young please advise.  Thanks! 

## 2014-08-12 NOTE — Telephone Encounter (Signed)
Atc, na, wcb

## 2014-08-12 NOTE — Telephone Encounter (Signed)
Ok to put together a couple of held-slots if we can to help her.

## 2014-08-13 NOTE — Telephone Encounter (Signed)
Spoke with the pt and notified of appt date and time and this was okay with her so appt set for 08/22/14 at 11:15 am  Nothing further needed

## 2014-08-22 ENCOUNTER — Ambulatory Visit (INDEPENDENT_AMBULATORY_CARE_PROVIDER_SITE_OTHER): Payer: 59 | Admitting: Internal Medicine

## 2014-08-22 ENCOUNTER — Encounter (INDEPENDENT_AMBULATORY_CARE_PROVIDER_SITE_OTHER): Payer: Self-pay

## 2014-08-22 ENCOUNTER — Encounter: Payer: Self-pay | Admitting: Internal Medicine

## 2014-08-22 VITALS — BP 114/66 | HR 74 | Ht 62.0 in | Wt 161.0 lb

## 2014-08-22 DIAGNOSIS — Z889 Allergy status to unspecified drugs, medicaments and biological substances status: Secondary | ICD-10-CM

## 2014-08-22 DIAGNOSIS — Z888 Allergy status to other drugs, medicaments and biological substances status: Secondary | ICD-10-CM | POA: Insufficient documentation

## 2014-08-22 DIAGNOSIS — J302 Other seasonal allergic rhinitis: Secondary | ICD-10-CM

## 2014-08-22 DIAGNOSIS — J3089 Other allergic rhinitis: Principal | ICD-10-CM

## 2014-08-22 DIAGNOSIS — J309 Allergic rhinitis, unspecified: Secondary | ICD-10-CM

## 2014-08-22 NOTE — Patient Instructions (Signed)
During your allergy seasons- Suggest you try daily orc antihistamine pill, like Allegra/ fexofenadine, or Claritin/ loratadine  And a daily otc steroid nasal spray like Flonase/ fluticasone- 1 or 2 puffs in each nostril once daily at bedtime  Order- lab- Allergy profile    Dx Seasonal and Perennial Allergic Rhinitis  Consider the environmental allergy control information we gave you.

## 2014-08-22 NOTE — Assessment & Plan Note (Signed)
Symptoms strongly consistent with allergic rhinitis. Can't exclude very minimal asthma component. I did not encourage bringing her cat rabbit into the home as pets and we reviewed environmental precautions. Plan-try maintenance combination of Allegra and Flonase nasal spray. Lab for allergy profile. If appropriate we can consider allergy vaccine trial

## 2014-08-22 NOTE — Progress Notes (Signed)
08/22/14- 44 yo F Self referral allergy evaluation - Spring and Winter bother her; certain perfumes and smells bother her as well. Moved here from, [place] 18 years ago. Gradually over her first few years living in this area, she developed a recurrent pattern of sneezing, itching of ears eyes and nose, throat soreness, watery rhinorrhea. Sometimes she will please a little bit while lying down. She does best in the summer. Symptoms develop in mid fall, are wrapped to last for the winter and into the spring. House dust, strong odors and seasonal pollens are the most obvious triggers. She has been treating with over-the-counter antihistamine by her description. Never diagnosed asthma. Aspirin causes "swelling". 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 Lives in a house with no basement, no smokers, twin 76-year-old boys. She is considering whether to get a rabbit or cat as a pet for the boys. An older son has allergic rhinitis. Symptom pattern has continued for any years and she is hoping we can consider allergy vaccine.  Prior to Admission medications   Medication Sig Start Date End Date Taking? Authorizing Provider  HYDROcodone-acetaminophen (NORCO/VICODIN) 5-325 MG per tablet 1-2 tablets po q 6 hours prn moderate to severe pain 04/01/13  Yes Kingsley Spittle, MD  meloxicam (MOBIC) 15 MG tablet TAKE 1 TABLET BY MOUTH ONCE DAILY 02/03/14  Yes Lyndal Pulley, DO  naproxen sodium (ANAPROX) 220 MG tablet Take 220 mg by mouth 2 (two) times daily as needed (pain).   Yes Historical Provider, MD   Past Medical History  Diagnosis Date  . Chronic headaches    Past Surgical History  Procedure Laterality Date  . Myomectomy    . Cesarean section     Family History  Problem Relation Age of Onset  . Allergies Paternal Grandfather   . Allergies Father    History   Social History  . Marital Status: Married    Spouse Name: N/A  . Number of Children: 2  . Years of Education: N/A   Occupational  History  . lab tech    Social History Main Topics  . Smoking status: Never Smoker   . Smokeless tobacco: Not on file  . Alcohol Use: 1.2 oz/week    2 Shots of liquor per week     Comment: occasionally  . Drug Use: No  . Sexual Activity: Yes   Other Topics Concern  . Not on file   Social History Narrative   ROS-see HPI   Negative unless "+" Constitutional:    weight loss, night sweats, fevers, chills, fatigue, lassitude. HEENT:    +headaches, difficulty swallowing, tooth/dental problems, +sore throat,       +sneezing,+ itching, +ear ache, +nasal congestion, post nasal drip, snoring CV:    chest pain, orthopnea, PND, swelling in lower extremities, anasarca,                                  dizziness, palpitations Resp:   shortness of breath with exertion or at rest.                productive cough,   +non-productive cough, coughing up of blood.              change in color of mucus.  wheezing.   Skin:    rash or lesions. GI:  No-   heartburn, indigestion, abdominal pain, nausea, vomiting, diarrhea,  change in bowel habits, loss of appetite GU: dysuria, change in color of urine, no urgency or frequency.   flank pain. MS:   joint pain, stiffness, decreased range of motion, back pain. Neuro-     nothing unusual Psych:  change in mood or affect.  depression or anxiety.   memory loss.  OBJ- Physical Exam General- Alert, Oriented, Affect-appropriate, Distress- none acute Skin- rash-none, lesions- none, excoriation- none Lymphadenopathy- none Head- atraumatic            Eyes- Gross vision intact, PERRLA, conjunctivae and secretions clear            Ears- Hearing, canals-normal            Nose- + pale mucosa, no-Septal dev, mucus, polyps, erosion, perforation             Throat- Mallampati II-III , mucosa clear , drainage- none, tonsils- atrophic Neck- flexible , trachea midline, no stridor , thyroid nl, carotid no bruit Chest - symmetrical excursion , unlabored            Heart/CV- RRR , no murmur , no gallop  , no rub, nl s1 s2                           - JVD- none , edema- none, stasis changes- none, varices- none           Lung- clear to P&A, wheeze- none, cough- none , dullness-none, rub- none           Chest wall-  Abd-  Br/ Gen/ Rectal- Not done, not indicated Extrem- cyanosis- none, clubbing, none, atrophy- none, strength- nl Neuro- grossly intact to observation

## 2014-08-22 NOTE — Assessment & Plan Note (Signed)
I do not see nasal polyps. Plan-avoid aspirin. Watch need to avoid selected NSAIDs

## 2014-08-26 ENCOUNTER — Other Ambulatory Visit: Payer: 59

## 2014-08-26 ENCOUNTER — Other Ambulatory Visit: Payer: Self-pay | Admitting: Internal Medicine

## 2014-08-26 DIAGNOSIS — J302 Other seasonal allergic rhinitis: Secondary | ICD-10-CM

## 2014-08-26 DIAGNOSIS — J3089 Other allergic rhinitis: Principal | ICD-10-CM

## 2014-08-27 LAB — ALLERGY FULL PROFILE
Allergen, D pternoyssinus,d7: 1.31 kU/L — ABNORMAL HIGH
Allergen,Goose feathers, e70: <0.1 kU/L
Aspergillus fumigatus, m3: <0.1 kU/L
BAHIA GRASS: 0.25 kU/L — AB
BERMUDA GRASS: 0.23 kU/L — AB
Box Elder IgE: 0.47 kU/L — ABNORMAL HIGH
COMMON RAGWEED: 0.23 kU/L — AB
Cat Dander: <0.1 kU/L
D. farinae: 2.66 kU/L — ABNORMAL HIGH
Dog Dander: <0.1 kU/L
Elm IgE: 5.27 kU/L — ABNORMAL HIGH
FESCUE: 0.24 kU/L — AB
G005 Rye, Perennial: 0.23 kU/L — ABNORMAL HIGH
G009 Red Top: 0.22 kU/L — ABNORMAL HIGH
Goldenrod: 0.15 kU/L — ABNORMAL HIGH
Helminthosporium halodes: <0.1 kU/L
House Dust Hollister: 0.22 kU/L — ABNORMAL HIGH
IgE (Immunoglobulin E), Serum: 69 kU/L (ref <115)
Lamb's Quarters: 0.27 kU/L — ABNORMAL HIGH
Oak: 0.47 kU/L — ABNORMAL HIGH
Plantain: 1.38 kU/L — ABNORMAL HIGH
SYCAMORE TREE: 0.26 kU/L — AB
Stemphylium Botryosum: <0.1 kU/L
Timothy Grass: 0.25 kU/L — ABNORMAL HIGH

## 2014-10-24 ENCOUNTER — Encounter: Payer: Self-pay | Admitting: Internal Medicine

## 2014-10-24 ENCOUNTER — Ambulatory Visit (INDEPENDENT_AMBULATORY_CARE_PROVIDER_SITE_OTHER): Payer: 59 | Admitting: Internal Medicine

## 2014-10-24 VITALS — BP 108/62 | HR 72 | Ht 62.0 in | Wt 165.6 lb

## 2014-10-24 DIAGNOSIS — J309 Allergic rhinitis, unspecified: Secondary | ICD-10-CM | POA: Diagnosis not present

## 2014-10-24 DIAGNOSIS — J302 Other seasonal allergic rhinitis: Secondary | ICD-10-CM

## 2014-10-24 DIAGNOSIS — J3089 Other allergic rhinitis: Principal | ICD-10-CM

## 2014-10-24 NOTE — Progress Notes (Signed)
08/22/14- 44 yo F Self referral allergy evaluation - Spring and Winter bother her; certain perfumes and smells bother her as well. Moved here from, [place] 18 years ago. Gradually over her first few years living in this area, she developed a recurrent pattern of sneezing, itching of ears eyes and nose, throat soreness, watery rhinorrhea. Sometimes she will please a little bit while lying down. She does best in the summer. Symptoms develop in mid fall, are wrapped to last for the winter and into the spring. House dust, strong odors and seasonal pollens are the most obvious triggers. She has been treating with over-the-counter antihistamine by her description. Never diagnosed asthma. Aspirin causes "swelling". 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 Lives in a house with no basement, no smokers, twin 51-year-old boys. She is considering whether to get a rabbit or cat as a pet for the boys. An older son has allergic rhinitis. Symptom pattern has continued for many years and she is hoping we can consider allergy vaccine.  10/24/14- 44 yo F Self referral allergy evaluation - Spring and Winter bother her; certain perfumes and smells bother her as well. Follows for: C/o nasal cong, PND, gets up clear phlem, blows out clear phlem Allergy profile 08/26/14- Total IgE 69, specific elevations for dust mite, grass, tree and weed pollens. Much ongoing drainage in nose and throat despite in the spring pollen season. Skin itches from warm water in shower, relieved by lotion, without rash. No chest tightness or wheeze and nothing purulent. She feels she has done what she can with antihistamines and nasal sprays and remains interested in trying allergy shots which we discussed in etail.  ROS-see HPI   Negative unless "+" Constitutional:    weight loss, night sweats, fevers, chills, fatigue, lassitude. HEENT:    +headaches, difficulty swallowing, tooth/dental problems, +sore throat,       +sneezing,+ itching,  +ear ache, +nasal congestion, post nasal drip, snoring CV:    chest pain, orthopnea, PND, swelling in lower extremities, anasarca,                                  dizziness, palpitations Resp:   shortness of breath with exertion or at rest.                productive cough,   +non-productive cough, coughing up of blood.              change in color of mucus.  wheezing.   Skin:    rash or lesions. GI:  No-   heartburn, indigestion, abdominal pain, nausea, vomiting, diarrhea,                 change in bowel habits, loss of appetite GU: dysuria, change in color of urine, no urgency or frequency.   flank pain. MS:   joint pain, stiffness, decreased range of motion, back pain. Neuro-     nothing unusual Psych:  change in mood or affect.  depression or anxiety.   memory loss.  OBJ- Physical Exam General- Alert, Oriented, Affect-appropriate, Distress- none acute Skin- rash-none, lesions- none, excoriation- none Lymphadenopathy- none Head- atraumatic            Eyes- Gross vision intact, PERRLA, conjunctivae and secretions clear            Ears- Hearing, canals-normal            Nose- + pale mucosa,  no-Septal dev, mucus, polyps, erosion, perforation             Throat- Mallampati II-III , mucosa clear , drainage- none, tonsils-                 atrophic, signed husky voice with throat clearing Neck- flexible , trachea midline, no stridor , thyroid nl, carotid no bruit Chest - symmetrical excursion , unlabored           Heart/CV- RRR , no murmur , no gallop  , no rub, nl s1 s2                           - JVD- none , edema- none, stasis changes- none, varices- none           Lung- clear to P&A, wheeze- none, cough- none , dullness-none, rub- none           Chest wall-  Abd-  Br/ Gen/ Rectal- Not done, not indicated Extrem- cyanosis- none, clubbing, none, atrophy- none, strength- nl Neuro- grossly intact to observation

## 2014-10-24 NOTE — Patient Instructions (Signed)
We will be able to make an allergy vaccine for you based on your blood test.  I will have the allergy lab call you about getting started with this.   You can continue to use an otc antihistamine like claritin, zyrtec or allegra. You can continue using and otc nasal spray like fluticasone/ Flonase if needed.

## 2014-10-24 NOTE — Assessment & Plan Note (Signed)
Perennial symptoms, worse in spring and fall, not adequately controlled with antihistamines and nasal steroid sprays. She is interested in trying allergy vaccine. We discussed realistic risk benefit considerations, availability of sublingual immunotherapy, environmental controls. Plan-start allergy vaccine given injections here per protocol

## 2014-10-30 ENCOUNTER — Telehealth: Payer: Self-pay | Admitting: Internal Medicine

## 2014-10-30 ENCOUNTER — Ambulatory Visit (INDEPENDENT_AMBULATORY_CARE_PROVIDER_SITE_OTHER): Payer: 59

## 2014-10-30 DIAGNOSIS — J309 Allergic rhinitis, unspecified: Secondary | ICD-10-CM

## 2014-10-30 NOTE — Telephone Encounter (Signed)
Date Mixed: 10/30/14 Vial: 1 Strength: 1:50000 Here/Mail/Pick Up: here Mixed By: tbs

## 2014-11-04 ENCOUNTER — Ambulatory Visit (INDEPENDENT_AMBULATORY_CARE_PROVIDER_SITE_OTHER): Payer: 59

## 2014-11-04 DIAGNOSIS — J309 Allergic rhinitis, unspecified: Secondary | ICD-10-CM | POA: Diagnosis not present

## 2014-11-07 ENCOUNTER — Ambulatory Visit (INDEPENDENT_AMBULATORY_CARE_PROVIDER_SITE_OTHER): Payer: 59

## 2014-11-07 DIAGNOSIS — J309 Allergic rhinitis, unspecified: Secondary | ICD-10-CM | POA: Diagnosis not present

## 2014-11-10 ENCOUNTER — Ambulatory Visit (INDEPENDENT_AMBULATORY_CARE_PROVIDER_SITE_OTHER): Payer: 59

## 2014-11-10 DIAGNOSIS — J309 Allergic rhinitis, unspecified: Secondary | ICD-10-CM | POA: Diagnosis not present

## 2014-11-13 ENCOUNTER — Ambulatory Visit (INDEPENDENT_AMBULATORY_CARE_PROVIDER_SITE_OTHER): Payer: 59

## 2014-11-13 DIAGNOSIS — J309 Allergic rhinitis, unspecified: Secondary | ICD-10-CM

## 2014-11-17 ENCOUNTER — Ambulatory Visit: Payer: 59

## 2014-11-18 ENCOUNTER — Ambulatory Visit (INDEPENDENT_AMBULATORY_CARE_PROVIDER_SITE_OTHER): Payer: 59

## 2014-11-18 DIAGNOSIS — J309 Allergic rhinitis, unspecified: Secondary | ICD-10-CM | POA: Diagnosis not present

## 2014-11-21 ENCOUNTER — Ambulatory Visit (INDEPENDENT_AMBULATORY_CARE_PROVIDER_SITE_OTHER): Payer: 59

## 2014-11-21 DIAGNOSIS — J309 Allergic rhinitis, unspecified: Secondary | ICD-10-CM

## 2014-11-24 ENCOUNTER — Ambulatory Visit (INDEPENDENT_AMBULATORY_CARE_PROVIDER_SITE_OTHER): Payer: 59 | Admitting: Internal Medicine

## 2014-11-24 ENCOUNTER — Encounter: Payer: Self-pay | Admitting: Internal Medicine

## 2014-11-24 ENCOUNTER — Other Ambulatory Visit (INDEPENDENT_AMBULATORY_CARE_PROVIDER_SITE_OTHER): Payer: 59

## 2014-11-24 VITALS — BP 124/84 | HR 66 | Temp 98.5°F | Resp 16 | Ht 62.0 in | Wt 164.8 lb

## 2014-11-24 DIAGNOSIS — J3089 Other allergic rhinitis: Secondary | ICD-10-CM

## 2014-11-24 DIAGNOSIS — Z0000 Encounter for general adult medical examination without abnormal findings: Secondary | ICD-10-CM

## 2014-11-24 DIAGNOSIS — J302 Other seasonal allergic rhinitis: Secondary | ICD-10-CM

## 2014-11-24 LAB — LIPID PANEL
Cholesterol: 198 mg/dL (ref 0–200)
HDL: 43.8 mg/dL (ref >39.00)
LDL Cholesterol: 139 mg/dL — ABNORMAL HIGH (ref 0–99)
NonHDL: 154.2
Total CHOL/HDL Ratio: 5
Triglycerides: 75 mg/dL (ref 0.0–149.0)
VLDL: 15 mg/dL (ref 0.0–40.0)

## 2014-11-24 LAB — CBC
HCT: 38.8 % (ref 36.0–46.0)
Hemoglobin: 13.1 g/dL (ref 12.0–15.0)
MCHC: 33.6 g/dL (ref 30.0–36.0)
MCV: 80.5 fl (ref 78.0–100.0)
PLATELETS: 318 10*3/uL (ref 150.0–400.0)
RBC: 4.82 Mil/uL (ref 3.87–5.11)
RDW: 13.7 % (ref 11.5–15.5)
WBC: 5.4 10*3/uL (ref 4.0–10.5)

## 2014-11-24 LAB — COMPREHENSIVE METABOLIC PANEL
ALT: 12 U/L (ref 0–35)
AST: 14 U/L (ref 0–37)
Albumin: 4.3 g/dL (ref 3.5–5.2)
Alkaline Phosphatase: 55 U/L (ref 39–117)
BILIRUBIN TOTAL: 0.3 mg/dL (ref 0.2–1.2)
BUN: 14 mg/dL (ref 6–23)
CHLORIDE: 104 meq/L (ref 96–112)
CO2: 28 meq/L (ref 19–32)
Calcium: 9.1 mg/dL (ref 8.4–10.5)
Creatinine, Ser: 0.78 mg/dL (ref 0.40–1.20)
GFR: 104.04 mL/min (ref >60.00)
Glucose, Bld: 97 mg/dL (ref 70–99)
Potassium: 3.7 mEq/L (ref 3.5–5.1)
Sodium: 140 mEq/L (ref 135–145)
TOTAL PROTEIN: 7.1 g/dL (ref 6.0–8.3)

## 2014-11-24 NOTE — Assessment & Plan Note (Signed)
Seeing allergy for shots for her allergies and doing better. Still some mild symptoms and using OTC medications as needed.

## 2014-11-24 NOTE — Progress Notes (Signed)
Pre visit review using our clinic review tool, if applicable. No additional management support is needed unless otherwise documented below in the visit note. 

## 2014-11-24 NOTE — Progress Notes (Signed)
   Subjective:    Patient ID: ARANDA BIHM, female    DOB: [birth year], 44 y.o.   MRN: [id number]  HPI The patient is a 44 YO female who is coming in for wellness. Some mild neck pain she thinks is related to her pillow. No other complaints. Has twins 5.42 YO.   PMH, [facility], social history reviewed and updated.   Review of Systems  Constitutional: Negative for fever, activity change, appetite change, fatigue and unexpected weight change.  HENT: Negative.   Eyes: Negative.   Respiratory: Negative for cough, chest tightness, shortness of breath and wheezing.   Cardiovascular: Negative for chest pain, palpitations and leg swelling.  Gastrointestinal: Positive for constipation. Negative for nausea, abdominal pain, diarrhea and abdominal distention.       Mild, relieved with diet.   Musculoskeletal: Positive for neck stiffness. Negative for myalgias, back pain, arthralgias, gait problem and neck pain.  Skin: Negative.   Neurological: Negative.   Psychiatric/Behavioral: Negative.       Objective:   Physical Exam  Constitutional: She is oriented to person, place, and time. She appears well-developed and well-nourished.  HENT:  Head: Normocephalic and atraumatic.  Eyes: EOM are normal.  Neck: Normal range of motion.  Cardiovascular: Normal rate and regular rhythm.   Pulmonary/Chest: Effort normal. No respiratory distress. She has no wheezes. She has no rales.  Abdominal: Soft. Bowel sounds are normal. She exhibits no distension. There is no tenderness. There is no rebound.  Musculoskeletal: She exhibits no edema.  Neurological: She is alert and oriented to person, place, and time. Coordination normal.  Skin: Skin is warm and dry.  Psychiatric: She has a normal mood and affect.   Filed Vitals:   11/24/14 0859  BP: 124/84  Pulse: 66  Temp: 98.5 F (36.9 C)  TempSrc: Oral  Resp: 16  Height: 5\' 2"  (1.575 m)  Weight: 164 lb 12.8 oz (74.753 kg)  SpO2: 98%      Assessment & Plan:

## 2014-11-24 NOTE — Assessment & Plan Note (Signed)
Given stretching exercises for her neck and advised she can take alleve. Checking labs. Up to date on screening and immunizations. Exercises regularly and non-smoker. Has twin 5.42 YO males.

## 2014-11-24 NOTE — Patient Instructions (Signed)
We will check the lab work today and call you back with the results.   Think about using heating pad on the neck when it is hurting. You can also use the alleve twice a day when you have the pain. I have also included some exercises to strengthen the muscles in the neck to give better support for it.   Come back in about 1 year if you are doing well, if you have any new problems or questions before then please feel free to call the office.   For good healthy bowels we need fiber and water, consider working some high fiber foods into your diet and make sure you get 4-6 glasses of water or liquids daily.   High-Fiber Diet Fiber is found in fruits, vegetables, and grains. A high-fiber diet encourages the addition of more whole grains, legumes, fruits, and vegetables in your diet. The recommended amount of fiber for adult males is 38 g per day. For adult females, it is 25 g per day. Pregnant and lactating women should get 28 g of fiber per day. If you have a digestive or bowel problem, ask your caregiver for advice before adding high-fiber foods to your diet. Eat a variety of high-fiber foods instead of only a select few type of foods.  PURPOSE  To increase stool bulk.  To make bowel movements more regular to prevent constipation.  To lower cholesterol.  To prevent overeating. WHEN IS THIS DIET USED?  It may be used if you have constipation and hemorrhoids.  It may be used if you have uncomplicated diverticulosis (intestine condition) and irritable bowel syndrome.  It may be used if you need help with weight management.  It may be used if you want to add it to your diet as a protective measure against atherosclerosis, diabetes, and cancer. SOURCES OF FIBER  Whole-grain breads and cereals.  Fruits, such as apples, oranges, bananas, berries, prunes, and pears.  Vegetables, such as green peas, carrots, sweet potatoes, beets, broccoli, cabbage, spinach, and artichokes.  Legumes, such  split peas, soy, lentils.  Almonds. FIBER CONTENT IN FOODS Starches and Grains / Dietary Fiber (g)  Cheerios, 1 cup / 3 g  Corn Flakes cereal, 1 cup / 0.7 g  Rice crispy treat cereal, 1 cup / 0.3 g  Instant oatmeal (cooked),  cup / 2 g  Frosted wheat cereal, 1 cup / 5.1 g  Brown, long-grain rice (cooked), 1 cup / 3.5 g  White, long-grain rice (cooked), 1 cup / 0.6 g  Enriched macaroni (cooked), 1 cup / 2.5 g Legumes / Dietary Fiber (g)  Baked beans (canned, plain, or vegetarian),  cup / 5.2 g  Kidney beans (canned),  cup / 6.8 g  Pinto beans (cooked),  cup / 5.5 g Breads and Crackers / Dietary Fiber (g)  Plain or honey graham crackers, 2 squares / 0.7 g  Saltine crackers, 3 squares / 0.3 g  Plain, salted pretzels, 10 pieces / 1.8 g  Whole-wheat bread, 1 slice / 1.9 g  White bread, 1 slice / 0.7 g  Raisin bread, 1 slice / 1.2 g  Plain bagel, 3 oz / 2 g  Flour tortilla, 1 oz / 0.9 g  Corn tortilla, 1 small / 1.5 g  Hamburger or hotdog bun, 1 small / 0.9 g Fruits / Dietary Fiber (g)  Apple with skin, 1 medium / 4.4 g  Sweetened applesauce,  cup / 1.5 g  Banana,  medium / 1.5 g  Grapes,  10 grapes / 0.4 g  Orange, 1 small / 2.3 g  Raisin, 1.5 oz / 1.6 g  Melon, 1 cup / 1.4 g Vegetables / Dietary Fiber (g)  Green beans (canned),  cup / 1.3 g  Carrots (cooked),  cup / 2.3 g  Broccoli (cooked),  cup / 2.8 g  Peas (cooked),  cup / 4.4 g  Mashed potatoes,  cup / 1.6 g  Lettuce, 1 cup / 0.5 g  Corn (canned),  cup / 1.6 g  Tomato,  cup / 1.1 g Document Released: 04/18/2005 Document Revised: 10/18/2011 Document Reviewed: 07/21/2011 Exit[employer] Patient Information 2015 [place], [place]. This information is not intended to replace advice given to you by your health care provider. Make sure you discuss any questions you have with your health care provider.

## 2014-11-25 ENCOUNTER — Ambulatory Visit (INDEPENDENT_AMBULATORY_CARE_PROVIDER_SITE_OTHER): Payer: 59

## 2014-11-25 DIAGNOSIS — J309 Allergic rhinitis, unspecified: Secondary | ICD-10-CM | POA: Diagnosis not present

## 2014-11-26 ENCOUNTER — Ambulatory Visit (INDEPENDENT_AMBULATORY_CARE_PROVIDER_SITE_OTHER): Payer: 59

## 2014-11-26 ENCOUNTER — Telehealth: Payer: Self-pay | Admitting: Internal Medicine

## 2014-11-26 DIAGNOSIS — J309 Allergic rhinitis, unspecified: Secondary | ICD-10-CM | POA: Diagnosis not present

## 2014-11-26 NOTE — Telephone Encounter (Signed)
Date Mixed: 11/26/14 Vial: 1  Strength: 1:5000 Here/Mail/Pick Up: here Mixed By: tbs

## 2014-11-28 ENCOUNTER — Ambulatory Visit (INDEPENDENT_AMBULATORY_CARE_PROVIDER_SITE_OTHER): Payer: 59

## 2014-11-28 DIAGNOSIS — J309 Allergic rhinitis, unspecified: Secondary | ICD-10-CM

## 2014-12-02 ENCOUNTER — Ambulatory Visit (INDEPENDENT_AMBULATORY_CARE_PROVIDER_SITE_OTHER): Payer: 59

## 2014-12-02 ENCOUNTER — Telehealth: Payer: Self-pay | Admitting: Internal Medicine

## 2014-12-02 DIAGNOSIS — J309 Allergic rhinitis, unspecified: Secondary | ICD-10-CM | POA: Diagnosis not present

## 2014-12-02 NOTE — Telephone Encounter (Signed)
Received records from [facility] forwarded 36 pages to Dr. Doug Sou 12/02/14 fbg.

## 2014-12-05 ENCOUNTER — Ambulatory Visit (INDEPENDENT_AMBULATORY_CARE_PROVIDER_SITE_OTHER): Payer: 59

## 2014-12-05 DIAGNOSIS — J309 Allergic rhinitis, unspecified: Secondary | ICD-10-CM

## 2014-12-09 ENCOUNTER — Ambulatory Visit (INDEPENDENT_AMBULATORY_CARE_PROVIDER_SITE_OTHER): Payer: 59

## 2014-12-09 DIAGNOSIS — J309 Allergic rhinitis, unspecified: Secondary | ICD-10-CM

## 2014-12-12 ENCOUNTER — Ambulatory Visit (INDEPENDENT_AMBULATORY_CARE_PROVIDER_SITE_OTHER): Payer: 59

## 2014-12-12 DIAGNOSIS — J309 Allergic rhinitis, unspecified: Secondary | ICD-10-CM | POA: Diagnosis not present

## 2014-12-16 ENCOUNTER — Ambulatory Visit (INDEPENDENT_AMBULATORY_CARE_PROVIDER_SITE_OTHER): Payer: 59

## 2014-12-16 DIAGNOSIS — J309 Allergic rhinitis, unspecified: Secondary | ICD-10-CM

## 2014-12-18 ENCOUNTER — Encounter: Payer: Self-pay | Admitting: Internal Medicine

## 2014-12-19 ENCOUNTER — Ambulatory Visit: Payer: 59

## 2014-12-22 ENCOUNTER — Ambulatory Visit (INDEPENDENT_AMBULATORY_CARE_PROVIDER_SITE_OTHER): Payer: 59

## 2014-12-22 DIAGNOSIS — J309 Allergic rhinitis, unspecified: Secondary | ICD-10-CM

## 2014-12-26 ENCOUNTER — Ambulatory Visit (INDEPENDENT_AMBULATORY_CARE_PROVIDER_SITE_OTHER): Payer: 59

## 2014-12-26 DIAGNOSIS — J309 Allergic rhinitis, unspecified: Secondary | ICD-10-CM | POA: Diagnosis not present

## 2014-12-30 ENCOUNTER — Ambulatory Visit (INDEPENDENT_AMBULATORY_CARE_PROVIDER_SITE_OTHER): Payer: 59

## 2014-12-30 DIAGNOSIS — J309 Allergic rhinitis, unspecified: Secondary | ICD-10-CM

## 2014-12-31 ENCOUNTER — Ambulatory Visit (INDEPENDENT_AMBULATORY_CARE_PROVIDER_SITE_OTHER): Payer: 59

## 2014-12-31 ENCOUNTER — Telehealth: Payer: Self-pay | Admitting: Internal Medicine

## 2014-12-31 DIAGNOSIS — J309 Allergic rhinitis, unspecified: Secondary | ICD-10-CM

## 2014-12-31 NOTE — Telephone Encounter (Signed)
Date Mixed: 12/31/14 Vial: 1 Strength: 1:500 Here/Mail/Pick Up: here Mixed By: tbs

## 2015-01-02 ENCOUNTER — Ambulatory Visit (INDEPENDENT_AMBULATORY_CARE_PROVIDER_SITE_OTHER): Payer: 59

## 2015-01-02 DIAGNOSIS — J309 Allergic rhinitis, unspecified: Secondary | ICD-10-CM | POA: Diagnosis not present

## 2015-01-06 ENCOUNTER — Ambulatory Visit (INDEPENDENT_AMBULATORY_CARE_PROVIDER_SITE_OTHER): Payer: 59

## 2015-01-06 DIAGNOSIS — J309 Allergic rhinitis, unspecified: Secondary | ICD-10-CM

## 2015-01-13 ENCOUNTER — Ambulatory Visit (INDEPENDENT_AMBULATORY_CARE_PROVIDER_SITE_OTHER): Payer: 59

## 2015-01-13 DIAGNOSIS — J309 Allergic rhinitis, unspecified: Secondary | ICD-10-CM | POA: Diagnosis not present

## 2015-01-13 DIAGNOSIS — Z23 Encounter for immunization: Secondary | ICD-10-CM

## 2015-01-16 ENCOUNTER — Ambulatory Visit (INDEPENDENT_AMBULATORY_CARE_PROVIDER_SITE_OTHER): Payer: 59

## 2015-01-16 DIAGNOSIS — J309 Allergic rhinitis, unspecified: Secondary | ICD-10-CM

## 2015-01-20 ENCOUNTER — Ambulatory Visit (INDEPENDENT_AMBULATORY_CARE_PROVIDER_SITE_OTHER): Payer: 59

## 2015-01-20 DIAGNOSIS — J309 Allergic rhinitis, unspecified: Secondary | ICD-10-CM

## 2015-01-23 ENCOUNTER — Ambulatory Visit (INDEPENDENT_AMBULATORY_CARE_PROVIDER_SITE_OTHER): Payer: 59

## 2015-01-23 DIAGNOSIS — J309 Allergic rhinitis, unspecified: Secondary | ICD-10-CM | POA: Diagnosis not present

## 2015-01-26 ENCOUNTER — Encounter: Payer: Self-pay | Admitting: Internal Medicine

## 2015-01-27 ENCOUNTER — Ambulatory Visit (INDEPENDENT_AMBULATORY_CARE_PROVIDER_SITE_OTHER): Payer: 59

## 2015-01-27 ENCOUNTER — Encounter: Payer: Self-pay | Admitting: *Deleted

## 2015-01-27 ENCOUNTER — Ambulatory Visit (INDEPENDENT_AMBULATORY_CARE_PROVIDER_SITE_OTHER): Payer: 59 | Admitting: Internal Medicine

## 2015-01-27 ENCOUNTER — Encounter: Payer: Self-pay | Admitting: Internal Medicine

## 2015-01-27 VITALS — BP 100/60 | HR 71 | Ht 62.0 in | Wt 160.4 lb

## 2015-01-27 DIAGNOSIS — J302 Other seasonal allergic rhinitis: Secondary | ICD-10-CM

## 2015-01-27 DIAGNOSIS — J309 Allergic rhinitis, unspecified: Secondary | ICD-10-CM

## 2015-01-27 DIAGNOSIS — Z888 Allergy status to other drugs, medicaments and biological substances status: Secondary | ICD-10-CM

## 2015-01-27 DIAGNOSIS — Z889 Allergy status to unspecified drugs, medicaments and biological substances status: Secondary | ICD-10-CM

## 2015-01-27 DIAGNOSIS — J3089 Other allergic rhinitis: Principal | ICD-10-CM

## 2015-01-27 NOTE — Progress Notes (Signed)
08/22/14- 44 yo F Self referral allergy evaluation - Spring and Winter bother her; certain perfumes and smells bother her as well. Moved here from, [place] 18 years ago. Gradually over her first few years living in this area, she developed a recurrent pattern of sneezing, itching of ears eyes and nose, throat soreness, watery rhinorrhea. Sometimes she will please a little bit while lying down. She does best in the summer. Symptoms develop in mid fall, are wrapped to last for the winter and into the spring. House dust, strong odors and seasonal pollens are the most obvious triggers. She has been treating with over-the-counter antihistamine by her description. Never diagnosed asthma. Aspirin causes "swelling". 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 Lives in a house with no basement, no smokers, twin 89-year-old boys. She is considering whether to get a rabbit or cat as a pet for the boys. An older son has allergic rhinitis. Symptom pattern has continued for many years and she is hoping we can consider allergy vaccine.  10/24/14- 53 yo F Self referral allergy evaluation - Spring and Winter bother her; certain perfumes and smells bother her as well. Follows for: C/o nasal cong, PND, gets up clear phlem, blows out clear phlem Allergy profile 08/26/14- Total IgE 69, specific elevations for dust mite, grass, tree and weed pollens. Much ongoing drainage in nose and throat despite in the spring pollen season. Skin itches from warm water in shower, relieved by lotion, without rash. No chest tightness or wheeze and nothing purulent. She feels she has done what she can with antihistamines and nasal sprays and remains interested in trying allergy shots which we discussed in detail.  01/27/15- 43 yoF never smoker followed for allergic rhinitis, aspirin allergy Allergy vaccine started 10/30/14 building          has had flu shot FOLLOWS FOR:No complaints,hoarseness comes and goes,worse in am,no pnd,denies  sneezing  Building allergy vaccine without problems. No longer sneezing. This would be very early to see a vaccine response and the change of seasons is probably helpful.  ROS-see HPI   Negative unless "+" Constitutional:    weight loss, night sweats, fevers, chills, fatigue, lassitude. HEENT:    +headaches, difficulty swallowing, tooth/dental problems, sore throat,       +sneezing,+ itching, +ear ache, +nasal congestion, post nasal drip, snoring CV:    chest pain, orthopnea, PND, swelling in lower extremities, anasarca,                                                             dizziness, palpitations Resp:   shortness of breath with exertion or at rest.                productive cough,   +non-productive cough, coughing up of blood.              change in color of mucus.  wheezing.   Skin:    rash or lesions. GI:  No-   heartburn, indigestion, abdominal pain, nausea, vomiting, diarrhea,                 change in bowel habits, loss of appetite GU: dysuria, change in color of urine, no urgency or frequency.   flank pain. MS:   joint pain, stiffness, decreased range of motion,  back pain. Neuro-     nothing unusual Psych:  change in mood or affect.  depression or anxiety.   memory loss.  OBJ- Physical Exam General- Alert, Oriented, Affect-appropriate, Distress- none acute Skin- rash-none, lesions- none, excoriation- none Lymphadenopathy- none Head- atraumatic            Eyes- Gross vision intact, PERRLA, conjunctivae and secretions clear            Ears- Hearing, canals-normal            Nose- + pale mucosa, no-Septal dev, mucus, polyps, erosion, perforation             Throat- Mallampati II-III , mucosa clear , drainage- none, tonsils-atrophic, signed husky voice with throat clearing Neck- flexible , trachea midline, no stridor , thyroid nl, carotid no bruit Chest - symmetrical excursion , unlabored           Heart/CV- RRR , no murmur , no gallop  , no rub, nl s1 s2                            - JVD- none , edema- none, stasis changes- none, varices- none           Lung- clear to P&A, wheeze- none, cough- none , dullness-none, rub- none           Chest wall-  Abd-  Br/ Gen/ Rectal- Not done, not indicated Extrem- cyanosis- none, clubbing, none, atrophy- none, strength- nl Neuro- grossly intact to observation

## 2015-01-27 NOTE — Patient Instructions (Signed)
Ok to continue allergy vaccine build-up  Consider using non-sedating otc antihistamines like Claritin/ loratadine or Allegra/ fexofenadine if Benadryl is too sedating.  Please call as needed

## 2015-01-30 ENCOUNTER — Ambulatory Visit: Payer: 59

## 2015-01-30 NOTE — Assessment & Plan Note (Signed)
Not sure what her past experience with Singulair was. Leukotriene antagonist therapy might be a useful adjunct-if not Singulair, then Accolate or Zyflo

## 2015-01-30 NOTE — Assessment & Plan Note (Signed)
She feels she is doing much better and credits the allergy vaccine although this is early. Plan-continue vaccine buildup

## 2015-02-02 ENCOUNTER — Telehealth: Payer: Self-pay | Admitting: Internal Medicine

## 2015-02-02 ENCOUNTER — Ambulatory Visit (INDEPENDENT_AMBULATORY_CARE_PROVIDER_SITE_OTHER): Payer: 59

## 2015-02-02 DIAGNOSIS — J309 Allergic rhinitis, unspecified: Secondary | ICD-10-CM

## 2015-02-02 NOTE — Telephone Encounter (Signed)
Allergy Serum Extract Date Mixed: 02/02/2015 Vial: A Strength: 1:50 Here/Mail/Pick Up: Here Mixed By: Desmond Dike, CMA

## 2015-02-03 ENCOUNTER — Ambulatory Visit (INDEPENDENT_AMBULATORY_CARE_PROVIDER_SITE_OTHER): Payer: 59

## 2015-02-03 DIAGNOSIS — J309 Allergic rhinitis, unspecified: Secondary | ICD-10-CM

## 2015-02-05 ENCOUNTER — Ambulatory Visit (INDEPENDENT_AMBULATORY_CARE_PROVIDER_SITE_OTHER): Payer: 59

## 2015-02-05 DIAGNOSIS — J309 Allergic rhinitis, unspecified: Secondary | ICD-10-CM | POA: Diagnosis not present

## 2015-02-10 ENCOUNTER — Ambulatory Visit (INDEPENDENT_AMBULATORY_CARE_PROVIDER_SITE_OTHER): Payer: 59

## 2015-02-10 DIAGNOSIS — J309 Allergic rhinitis, unspecified: Secondary | ICD-10-CM

## 2015-02-13 ENCOUNTER — Ambulatory Visit (INDEPENDENT_AMBULATORY_CARE_PROVIDER_SITE_OTHER): Payer: 59

## 2015-02-13 DIAGNOSIS — J309 Allergic rhinitis, unspecified: Secondary | ICD-10-CM

## 2015-02-17 ENCOUNTER — Ambulatory Visit (INDEPENDENT_AMBULATORY_CARE_PROVIDER_SITE_OTHER): Payer: 59

## 2015-02-17 DIAGNOSIS — J309 Allergic rhinitis, unspecified: Secondary | ICD-10-CM | POA: Diagnosis not present

## 2015-02-20 ENCOUNTER — Ambulatory Visit (INDEPENDENT_AMBULATORY_CARE_PROVIDER_SITE_OTHER): Payer: 59

## 2015-02-20 DIAGNOSIS — J309 Allergic rhinitis, unspecified: Secondary | ICD-10-CM

## 2015-02-24 ENCOUNTER — Ambulatory Visit: Payer: 59

## 2015-02-26 ENCOUNTER — Ambulatory Visit (INDEPENDENT_AMBULATORY_CARE_PROVIDER_SITE_OTHER): Payer: 59

## 2015-02-26 DIAGNOSIS — J309 Allergic rhinitis, unspecified: Secondary | ICD-10-CM

## 2015-03-06 ENCOUNTER — Ambulatory Visit (INDEPENDENT_AMBULATORY_CARE_PROVIDER_SITE_OTHER): Payer: 59

## 2015-03-06 DIAGNOSIS — J309 Allergic rhinitis, unspecified: Secondary | ICD-10-CM | POA: Diagnosis not present

## 2015-03-13 ENCOUNTER — Ambulatory Visit (INDEPENDENT_AMBULATORY_CARE_PROVIDER_SITE_OTHER): Payer: 59

## 2015-03-13 DIAGNOSIS — J309 Allergic rhinitis, unspecified: Secondary | ICD-10-CM | POA: Diagnosis not present

## 2015-03-20 ENCOUNTER — Ambulatory Visit (INDEPENDENT_AMBULATORY_CARE_PROVIDER_SITE_OTHER): Payer: 59

## 2015-03-20 DIAGNOSIS — J309 Allergic rhinitis, unspecified: Secondary | ICD-10-CM | POA: Diagnosis not present

## 2015-03-27 ENCOUNTER — Ambulatory Visit (INDEPENDENT_AMBULATORY_CARE_PROVIDER_SITE_OTHER): Payer: 59

## 2015-03-27 DIAGNOSIS — J309 Allergic rhinitis, unspecified: Secondary | ICD-10-CM | POA: Diagnosis not present

## 2015-04-02 ENCOUNTER — Other Ambulatory Visit: Payer: Self-pay | Admitting: Internal Medicine

## 2015-04-02 ENCOUNTER — Ambulatory Visit (INDEPENDENT_AMBULATORY_CARE_PROVIDER_SITE_OTHER): Payer: 59

## 2015-04-02 DIAGNOSIS — J309 Allergic rhinitis, unspecified: Secondary | ICD-10-CM | POA: Diagnosis not present

## 2015-04-02 MED ORDER — VITAMIN D3 125 MCG (5000 UT) PO CAPS: 5000.0000 [IU] | ORAL_CAPSULE | Freq: Every day | ORAL | Status: DC

## 2015-04-03 ENCOUNTER — Ambulatory Visit: Payer: 59

## 2015-04-10 ENCOUNTER — Ambulatory Visit (INDEPENDENT_AMBULATORY_CARE_PROVIDER_SITE_OTHER): Payer: 59

## 2015-04-10 DIAGNOSIS — J309 Allergic rhinitis, unspecified: Secondary | ICD-10-CM | POA: Diagnosis not present

## 2015-04-17 ENCOUNTER — Ambulatory Visit (INDEPENDENT_AMBULATORY_CARE_PROVIDER_SITE_OTHER): Payer: 59

## 2015-04-17 DIAGNOSIS — J309 Allergic rhinitis, unspecified: Secondary | ICD-10-CM

## 2015-04-24 ENCOUNTER — Ambulatory Visit (INDEPENDENT_AMBULATORY_CARE_PROVIDER_SITE_OTHER): Payer: 59

## 2015-04-24 DIAGNOSIS — J309 Allergic rhinitis, unspecified: Secondary | ICD-10-CM

## 2015-05-01 ENCOUNTER — Ambulatory Visit (INDEPENDENT_AMBULATORY_CARE_PROVIDER_SITE_OTHER): Payer: 59

## 2015-05-01 DIAGNOSIS — J309 Allergic rhinitis, unspecified: Secondary | ICD-10-CM | POA: Diagnosis not present

## 2015-05-08 ENCOUNTER — Ambulatory Visit (INDEPENDENT_AMBULATORY_CARE_PROVIDER_SITE_OTHER): Payer: 59

## 2015-05-08 DIAGNOSIS — J309 Allergic rhinitis, unspecified: Secondary | ICD-10-CM | POA: Diagnosis not present

## 2015-05-15 ENCOUNTER — Ambulatory Visit (INDEPENDENT_AMBULATORY_CARE_PROVIDER_SITE_OTHER): Payer: 59

## 2015-05-15 DIAGNOSIS — J309 Allergic rhinitis, unspecified: Secondary | ICD-10-CM | POA: Diagnosis not present

## 2015-05-22 ENCOUNTER — Ambulatory Visit (INDEPENDENT_AMBULATORY_CARE_PROVIDER_SITE_OTHER): Payer: 59

## 2015-05-22 DIAGNOSIS — J309 Allergic rhinitis, unspecified: Secondary | ICD-10-CM

## 2015-05-29 ENCOUNTER — Encounter: Payer: Self-pay | Admitting: Internal Medicine

## 2015-05-29 ENCOUNTER — Ambulatory Visit (INDEPENDENT_AMBULATORY_CARE_PROVIDER_SITE_OTHER): Payer: 59 | Admitting: Internal Medicine

## 2015-05-29 ENCOUNTER — Ambulatory Visit: Payer: 59

## 2015-05-29 VITALS — BP 124/68 | HR 72 | Ht 62.0 in | Wt 162.8 lb

## 2015-05-29 DIAGNOSIS — Z889 Allergy status to unspecified drugs, medicaments and biological substances status: Secondary | ICD-10-CM

## 2015-05-29 DIAGNOSIS — J3089 Other allergic rhinitis: Principal | ICD-10-CM

## 2015-05-29 DIAGNOSIS — J309 Allergic rhinitis, unspecified: Secondary | ICD-10-CM

## 2015-05-29 DIAGNOSIS — Z888 Allergy status to other drugs, medicaments and biological substances status: Secondary | ICD-10-CM

## 2015-05-29 DIAGNOSIS — J302 Other seasonal allergic rhinitis: Secondary | ICD-10-CM

## 2015-05-29 NOTE — Progress Notes (Signed)
08/22/14- 45 yo F Self referral allergy evaluation - Spring and Winter bother her; certain perfumes and smells bother her as well. Moved here from, [place] 18 years ago. Gradually over her first few years living in this area, she developed a recurrent pattern of sneezing, itching of ears eyes and nose, throat soreness, watery rhinorrhea. Sometimes she will please a little bit while lying down. She does best in the summer. Symptoms develop in mid fall, are wrapped to last for the winter and into the spring. House dust, strong odors and seasonal pollens are the most obvious triggers. She has been treating with over-the-counter antihistamine by her description. Never diagnosed asthma. Aspirin causes "swelling". 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 Lives in a house with no basement, no smokers, twin 61-year-old boys. She is considering whether to get a rabbit or cat as a pet for the boys. An older son has allergic rhinitis. Symptom pattern has continued for many years and she is hoping we can consider allergy vaccine.  10/24/14- 57 yo F Self referral allergy evaluation - Spring and Winter bother her; certain perfumes and smells bother her as well. Follows for: C/o nasal cong, PND, gets up clear phlem, blows out clear phlem Allergy profile 08/26/14- Total IgE 69, specific elevations for dust mite, grass, tree and weed pollens. Much ongoing drainage in nose and throat despite in the spring pollen season. Skin itches from warm water in shower, relieved by lotion, without rash. No chest tightness or wheeze and nothing purulent. She feels she has done what she can with antihistamines and nasal sprays and remains interested in trying allergy shots which we discussed in detail.  01/27/15- 43 yoF never smoker followed for allergic rhinitis, aspirin allergy Allergy vaccine started 10/30/14 building          has had flu shot FOLLOWS FOR:No complaints,hoarseness comes and goes,worse in am,no pnd,denies  sneezing  Building allergy vaccine without problems. No longer sneezing. This would be very early to see a vaccine response and the change of seasons is probably helpful.  05/29/2015-45 year old female never smoker followed for allergic rhinitis, aspirin allergy Allergy Vaccine 1:50 GH FOLLOWS FOR: Still on vaccine and doing well.  She recognizes no problems for her allergy vaccine and believes that she is having fewer episodes of respiratory inflammation since being on shots. Incidental note of mild husky voice recently without obvious infection.  ROS-see HPI   Negative unless "+" Constitutional:    weight loss, night sweats, fevers, chills, fatigue, lassitude. HEENT:    +headaches, difficulty swallowing, tooth/dental problems, sore throat,       sneezing, itching, ear ache, nasal congestion, post nasal drip, snoring CV:    chest pain, orthopnea, PND, swelling in lower extremities, anasarca,                                                             dizziness, palpitations Resp:   shortness of breath with exertion or at rest.                productive cough,   +non-productive cough, coughing up of blood.              change in color of mucus.  wheezing.   Skin:    rash or lesions. GI:  No-  heartburn, indigestion, abdominal pain, nausea, vomiting, diarrhea,                 change in bowel habits, loss of appetite GU: dysuria, change in color of urine, no urgency or frequency.   flank pain. MS:   joint pain, stiffness, decreased range of motion, back pain. Neuro-     nothing unusual Psych:  change in mood or affect.  depression or anxiety.   memory loss.  OBJ- Physical Exam General- Alert, Oriented, Affect-appropriate, Distress- none acute Skin- rash-none, lesions- none, excoriation- none Lymphadenopathy- none Head- atraumatic            Eyes- Gross vision intact, PERRLA, conjunctivae and secretions clear            Ears- Hearing, canals-normal            Nose- + pale mucosa,  no-Septal dev, mucus, polyps, erosion, perforation             Throat- Mallampati II-III , mucosa clear , drainage- none, tonsils-atrophic,  Neck- flexible , trachea midline, no stridor , thyroid nl, carotid no bruit Chest - symmetrical excursion , unlabored           Heart/CV- RRR , no murmur , no gallop  , no rub, nl s1 s2                           - JVD- none , edema- none, stasis changes- none, varices- none           Lung- clear to P&A, wheeze- none, cough- none , dullness-none, rub- none           Chest wall-  Abd-  Br/ Gen/ Rectal- Not done, not indicated Extrem- cyanosis- none, clubbing, none, atrophy- none, strength- nl Neuro- grossly intact to observation

## 2015-05-29 NOTE — Assessment & Plan Note (Signed)
She is careful to avoid aspirin with no recurrent episodes.

## 2015-05-29 NOTE — Patient Instructions (Addendum)
We will advance your allergy vaccine to 1:10 with next order  Please call as needed

## 2015-05-29 NOTE — Assessment & Plan Note (Signed)
She has done well reaching maintenance allergy vaccine concentration 1:50 and believes it is helping her. Plan-we can advance to 1:10 with next vaccine order

## 2015-06-02 ENCOUNTER — Ambulatory Visit: Payer: 59 | Admitting: Internal Medicine

## 2015-06-04 ENCOUNTER — Telehealth: Payer: Self-pay | Admitting: Internal Medicine

## 2015-06-04 DIAGNOSIS — J309 Allergic rhinitis, unspecified: Secondary | ICD-10-CM | POA: Diagnosis not present

## 2015-06-04 NOTE — Telephone Encounter (Signed)
Allergy Serum Extract Date Mixed: 06/04/15 Vial: 1 Strength: 1:10 Here/Mail/Pick Up: here Mixed By: tbs Last OV: 01/27/15 Pending OV: 05/29/15

## 2015-06-05 ENCOUNTER — Ambulatory Visit (INDEPENDENT_AMBULATORY_CARE_PROVIDER_SITE_OTHER): Payer: 59

## 2015-06-05 DIAGNOSIS — J309 Allergic rhinitis, unspecified: Secondary | ICD-10-CM

## 2015-06-12 ENCOUNTER — Ambulatory Visit (INDEPENDENT_AMBULATORY_CARE_PROVIDER_SITE_OTHER): Payer: 59

## 2015-06-12 DIAGNOSIS — J309 Allergic rhinitis, unspecified: Secondary | ICD-10-CM | POA: Diagnosis not present

## 2015-06-19 ENCOUNTER — Ambulatory Visit (INDEPENDENT_AMBULATORY_CARE_PROVIDER_SITE_OTHER): Payer: 59

## 2015-06-19 DIAGNOSIS — J309 Allergic rhinitis, unspecified: Secondary | ICD-10-CM | POA: Diagnosis not present

## 2015-06-26 ENCOUNTER — Ambulatory Visit (INDEPENDENT_AMBULATORY_CARE_PROVIDER_SITE_OTHER): Payer: 59

## 2015-06-26 DIAGNOSIS — J309 Allergic rhinitis, unspecified: Secondary | ICD-10-CM

## 2015-07-03 ENCOUNTER — Ambulatory Visit (INDEPENDENT_AMBULATORY_CARE_PROVIDER_SITE_OTHER): Payer: 59 | Admitting: *Deleted

## 2015-07-03 DIAGNOSIS — J309 Allergic rhinitis, unspecified: Secondary | ICD-10-CM | POA: Diagnosis not present

## 2015-07-07 ENCOUNTER — Other Ambulatory Visit: Payer: Self-pay

## 2015-07-07 DIAGNOSIS — Z1231 Encounter for screening mammogram for malignant neoplasm of breast: Secondary | ICD-10-CM

## 2015-07-10 ENCOUNTER — Ambulatory Visit (INDEPENDENT_AMBULATORY_CARE_PROVIDER_SITE_OTHER): Payer: 59 | Admitting: *Deleted

## 2015-07-10 DIAGNOSIS — J309 Allergic rhinitis, unspecified: Secondary | ICD-10-CM

## 2015-07-15 DIAGNOSIS — H524 Presbyopia: Secondary | ICD-10-CM | POA: Diagnosis not present

## 2015-07-17 ENCOUNTER — Ambulatory Visit (INDEPENDENT_AMBULATORY_CARE_PROVIDER_SITE_OTHER): Payer: 59 | Admitting: *Deleted

## 2015-07-17 DIAGNOSIS — J309 Allergic rhinitis, unspecified: Secondary | ICD-10-CM

## 2015-07-24 ENCOUNTER — Ambulatory Visit (INDEPENDENT_AMBULATORY_CARE_PROVIDER_SITE_OTHER): Payer: 59 | Admitting: *Deleted

## 2015-07-24 DIAGNOSIS — J309 Allergic rhinitis, unspecified: Secondary | ICD-10-CM | POA: Diagnosis not present

## 2015-07-29 ENCOUNTER — Ambulatory Visit
Admission: RE | Admit: 2015-07-29 | Discharge: 2015-07-29 | Disposition: A | Discharge status: Home / Self Care | Payer: 59 | Source: Ambulatory Visit

## 2015-07-29 DIAGNOSIS — Z1231 Encounter for screening mammogram for malignant neoplasm of breast: Secondary | ICD-10-CM | POA: Diagnosis not present

## 2015-07-30 MED FILL — PRENAISSANCE PLUS SOFTGEL: 28-1-250 | 90 days supply | Qty: 90 | Fill #1

## 2015-07-31 ENCOUNTER — Ambulatory Visit (INDEPENDENT_AMBULATORY_CARE_PROVIDER_SITE_OTHER): Payer: 59 | Admitting: *Deleted

## 2015-07-31 DIAGNOSIS — J309 Allergic rhinitis, unspecified: Secondary | ICD-10-CM

## 2015-08-03 DIAGNOSIS — Z3202 Encounter for pregnancy test, result negative: Secondary | ICD-10-CM | POA: Diagnosis not present

## 2015-08-06 ENCOUNTER — Ambulatory Visit (INDEPENDENT_AMBULATORY_CARE_PROVIDER_SITE_OTHER): Payer: 59 | Admitting: *Deleted

## 2015-08-06 DIAGNOSIS — J309 Allergic rhinitis, unspecified: Secondary | ICD-10-CM

## 2015-08-13 ENCOUNTER — Ambulatory Visit (INDEPENDENT_AMBULATORY_CARE_PROVIDER_SITE_OTHER): Payer: 59 | Admitting: *Deleted

## 2015-08-13 DIAGNOSIS — J309 Allergic rhinitis, unspecified: Secondary | ICD-10-CM | POA: Diagnosis not present

## 2015-08-21 ENCOUNTER — Ambulatory Visit (INDEPENDENT_AMBULATORY_CARE_PROVIDER_SITE_OTHER): Payer: 59 | Admitting: *Deleted

## 2015-08-21 DIAGNOSIS — J309 Allergic rhinitis, unspecified: Secondary | ICD-10-CM

## 2015-08-24 ENCOUNTER — Telehealth: Payer: Self-pay | Admitting: Geriatric Medicine

## 2015-08-24 MED ORDER — BENZONATATE 200 MG PO CAPS: 200.0000 mg | ORAL_CAPSULE | Freq: Three times a day (TID) | ORAL | Status: DC | PRN

## 2015-08-24 MED FILL — BENZONATATE 200 MG CAPSULE: 200 | 20 days supply | Qty: 60 | Fill #0

## 2015-08-24 NOTE — Telephone Encounter (Signed)
Patient has had a cough from allergies for 2 weeks. She has tried all kinds of over the counter medications and her chest hurts from coughing. She is requesting something to be called in for her cough. Please advise, thanks.

## 2015-08-24 NOTE — Telephone Encounter (Signed)
Patient aware.

## 2015-08-24 NOTE — Telephone Encounter (Signed)
Sent in [place] that she can use up to TID prn.

## 2015-08-28 ENCOUNTER — Ambulatory Visit (INDEPENDENT_AMBULATORY_CARE_PROVIDER_SITE_OTHER): Payer: 59 | Admitting: *Deleted

## 2015-08-28 DIAGNOSIS — J309 Allergic rhinitis, unspecified: Secondary | ICD-10-CM | POA: Diagnosis not present

## 2015-09-03 ENCOUNTER — Ambulatory Visit (INDEPENDENT_AMBULATORY_CARE_PROVIDER_SITE_OTHER): Payer: 59

## 2015-09-03 DIAGNOSIS — J309 Allergic rhinitis, unspecified: Secondary | ICD-10-CM

## 2015-09-04 ENCOUNTER — Ambulatory Visit: Payer: 59

## 2015-09-11 ENCOUNTER — Ambulatory Visit (INDEPENDENT_AMBULATORY_CARE_PROVIDER_SITE_OTHER): Payer: 59 | Admitting: *Deleted

## 2015-09-11 DIAGNOSIS — J309 Allergic rhinitis, unspecified: Secondary | ICD-10-CM

## 2015-09-18 ENCOUNTER — Ambulatory Visit (INDEPENDENT_AMBULATORY_CARE_PROVIDER_SITE_OTHER): Payer: 59

## 2015-09-18 ENCOUNTER — Telehealth: Payer: Self-pay | Admitting: *Deleted

## 2015-09-18 DIAGNOSIS — J309 Allergic rhinitis, unspecified: Secondary | ICD-10-CM

## 2015-09-18 NOTE — Telephone Encounter (Signed)
Mrs. Zajac got her allergy shot while I was at lunch today. She came up to show me her arm. (pt. Works downstairs in the lab.) Her lt. Arm was swollen like a 2 in. Square, warm to touch. Advised pt. To take antihistamine, and use a cold compress. You weren't here so I called Katie she said: to advise pt. To go to the ED over the weekend if needed. (I did.) Nothing further needed. Closing.

## 2015-09-24 ENCOUNTER — Ambulatory Visit (INDEPENDENT_AMBULATORY_CARE_PROVIDER_SITE_OTHER): Payer: 59 | Admitting: *Deleted

## 2015-09-24 DIAGNOSIS — J309 Allergic rhinitis, unspecified: Secondary | ICD-10-CM | POA: Diagnosis not present

## 2015-09-25 ENCOUNTER — Encounter: Payer: 59 | Admitting: Internal Medicine

## 2015-09-25 ENCOUNTER — Ambulatory Visit: Payer: 59

## 2015-10-02 ENCOUNTER — Ambulatory Visit (INDEPENDENT_AMBULATORY_CARE_PROVIDER_SITE_OTHER): Payer: 59 | Admitting: *Deleted

## 2015-10-02 DIAGNOSIS — J309 Allergic rhinitis, unspecified: Secondary | ICD-10-CM | POA: Diagnosis not present

## 2015-10-07 ENCOUNTER — Telehealth: Payer: Self-pay | Admitting: Internal Medicine

## 2015-10-07 DIAGNOSIS — J309 Allergic rhinitis, unspecified: Secondary | ICD-10-CM | POA: Diagnosis not present

## 2015-10-07 NOTE — Telephone Encounter (Signed)
Allergy Serum Extract Date Mixed: 10/07/15 Vial: 1 Strength: 1:10 Here/Mail/Pick Up: here Mixed By: tbs Last OV: 05/29/15 Pending OV: 05/30/16

## 2015-10-08 MED FILL — VITAMIN D3 5,000 UNIT CAP: 125 MCG | 100 days supply | Qty: 100 | Fill #1

## 2015-10-09 ENCOUNTER — Ambulatory Visit (INDEPENDENT_AMBULATORY_CARE_PROVIDER_SITE_OTHER): Payer: 59 | Admitting: *Deleted

## 2015-10-09 DIAGNOSIS — J309 Allergic rhinitis, unspecified: Secondary | ICD-10-CM | POA: Diagnosis not present

## 2015-10-15 ENCOUNTER — Ambulatory Visit (INDEPENDENT_AMBULATORY_CARE_PROVIDER_SITE_OTHER): Payer: 59 | Admitting: *Deleted

## 2015-10-15 DIAGNOSIS — J309 Allergic rhinitis, unspecified: Secondary | ICD-10-CM

## 2015-10-15 MED FILL — ATOVAQUONE-PROGUANIL 250-10: 250-100 | 29 days supply | Qty: 29 | Fill #0

## 2015-10-15 MED FILL — CIPROFLOXACIN HCL 500 MG TA: 500 | 6 days supply | Qty: 12 | Fill #0

## 2015-10-16 ENCOUNTER — Ambulatory Visit: Payer: 59

## 2015-10-22 ENCOUNTER — Ambulatory Visit (INDEPENDENT_AMBULATORY_CARE_PROVIDER_SITE_OTHER): Payer: 59 | Admitting: *Deleted

## 2015-10-22 DIAGNOSIS — J309 Allergic rhinitis, unspecified: Secondary | ICD-10-CM | POA: Diagnosis not present

## 2015-10-23 ENCOUNTER — Ambulatory Visit: Payer: 59

## 2015-10-30 ENCOUNTER — Ambulatory Visit: Payer: 59

## 2015-11-13 ENCOUNTER — Ambulatory Visit (INDEPENDENT_AMBULATORY_CARE_PROVIDER_SITE_OTHER): Payer: 59 | Admitting: *Deleted

## 2015-11-13 DIAGNOSIS — J309 Allergic rhinitis, unspecified: Secondary | ICD-10-CM | POA: Diagnosis not present

## 2015-11-19 ENCOUNTER — Ambulatory Visit (INDEPENDENT_AMBULATORY_CARE_PROVIDER_SITE_OTHER): Payer: 59 | Admitting: Internal Medicine

## 2015-11-19 ENCOUNTER — Encounter: Payer: Self-pay | Admitting: Internal Medicine

## 2015-11-19 ENCOUNTER — Other Ambulatory Visit (INDEPENDENT_AMBULATORY_CARE_PROVIDER_SITE_OTHER): Payer: 59

## 2015-11-19 VITALS — BP 130/72 | HR 71 | Temp 98.5°F | Resp 12 | Ht 62.0 in | Wt 164.8 lb

## 2015-11-19 DIAGNOSIS — J302 Other seasonal allergic rhinitis: Secondary | ICD-10-CM

## 2015-11-19 DIAGNOSIS — Z0000 Encounter for general adult medical examination without abnormal findings: Secondary | ICD-10-CM

## 2015-11-19 DIAGNOSIS — J3089 Other allergic rhinitis: Secondary | ICD-10-CM

## 2015-11-19 DIAGNOSIS — J309 Allergic rhinitis, unspecified: Secondary | ICD-10-CM

## 2015-11-19 LAB — COMPREHENSIVE METABOLIC PANEL
ALT: 20 U/L (ref 0–35)
AST: 18 U/L (ref 0–37)
Albumin: 4.5 g/dL (ref 3.5–5.2)
Alkaline Phosphatase: 66 U/L (ref 39–117)
BUN: 14 mg/dL (ref 6–23)
CALCIUM: 9.8 mg/dL (ref 8.4–10.5)
CHLORIDE: 100 meq/L (ref 96–112)
CO2: 30 meq/L (ref 19–32)
CREATININE: 0.99 mg/dL (ref 0.40–1.20)
GFR: 78.65 mL/min (ref >60.00)
Glucose, Bld: 86 mg/dL (ref 70–99)
Potassium: 3.5 mEq/L (ref 3.5–5.1)
SODIUM: 138 meq/L (ref 135–145)
Total Bilirubin: 0.2 mg/dL (ref 0.2–1.2)
Total Protein: 8.3 g/dL (ref 6.0–8.3)

## 2015-11-19 LAB — CBC
HCT: 38.5 % (ref 36.0–46.0)
Hemoglobin: 12.9 g/dL (ref 12.0–15.0)
MCHC: 33.4 g/dL (ref 30.0–36.0)
MCV: 78.8 fl (ref 78.0–100.0)
Platelets: 349 10*3/uL (ref 150.0–400.0)
RBC: 4.89 Mil/uL (ref 3.87–5.11)
RDW: 13.5 % (ref 11.5–15.5)
WBC: 7.9 10*3/uL (ref 4.0–10.5)

## 2015-11-19 LAB — LIPID PANEL
CHOL/HDL RATIO: 4
Cholesterol: 210 mg/dL — ABNORMAL HIGH (ref 0–200)
HDL: 47.5 mg/dL (ref >39.00)
LDL CALC: 140 mg/dL — AB (ref 0–99)
NonHDL: 162.65
TRIGLYCERIDES: 111 mg/dL (ref 0.0–149.0)
VLDL: 22.2 mg/dL (ref 0.0–40.0)

## 2015-11-19 NOTE — Progress Notes (Signed)
   Subjective:    Patient ID: Shirley Wallace, female    DOB: [birth year], 45 y.o.   MRN: [id number]  HPI The patient is a 45 YO female coming in for wellness. No new complaints or concerns.  PMH, [facility], social history reviewed and updated.  Review of Systems  Constitutional: Negative for fever, activity change, appetite change, fatigue and unexpected weight change.  HENT: Negative.   Eyes: Negative.   Respiratory: Negative for cough, chest tightness, shortness of breath and wheezing.   Cardiovascular: Negative for chest pain, palpitations and leg swelling.  Gastrointestinal: Negative for nausea, abdominal pain, diarrhea, constipation and abdominal distention.  Musculoskeletal: Positive for arthralgias. Negative for myalgias, back pain, gait problem, neck pain and neck stiffness.       Rare right knee  Skin: Negative.   Neurological: Negative.   Psychiatric/Behavioral: Negative.       Objective:   Physical Exam  Constitutional: She is oriented to person, place, and time. She appears well-developed and well-nourished.  HENT:  Head: Normocephalic and atraumatic.  Eyes: EOM are normal.  Neck: Normal range of motion.  Cardiovascular: Normal rate and regular rhythm.   Pulmonary/Chest: Effort normal. No respiratory distress. She has no wheezes. She has no rales.  Abdominal: Soft. Bowel sounds are normal. She exhibits no distension. There is no tenderness. There is no rebound.  Musculoskeletal: She exhibits no edema.  Neurological: She is alert and oriented to person, place, and time. Coordination normal.  Skin: Skin is warm and dry.  Psychiatric: She has a normal mood and affect.   Filed Vitals:   11/19/15 1443  BP: 130/72  Pulse: 71  Temp: 98.5 F (36.9 C)  TempSrc: Oral  Resp: 12  Height: 5\' 2"  (1.575 m)  Weight: 164 lb 12.8 oz (74.753 kg)  SpO2: 99%      Assessment & Plan:

## 2015-11-19 NOTE — Progress Notes (Signed)
Pre visit review using our clinic review tool, if applicable. No additional management support is needed unless otherwise documented below in the visit note. 

## 2015-11-19 NOTE — Assessment & Plan Note (Signed)
Is getting allergy injections which seem to be helping some but all symptoms not alleviated.

## 2015-11-19 NOTE — Patient Instructions (Signed)
We will check the labs today and call you back with the results.  Health Maintenance, Female Adopting a healthy lifestyle and getting preventive care can go a long way to promote health and wellness. Talk with your health care provider about what schedule of regular examinations is right for you. This is a good chance for you to check in with your provider about disease prevention and staying healthy. In between checkups, there are plenty of things you can do on your own. Experts have done a lot of research about which lifestyle changes and preventive measures are most likely to keep you healthy. Ask your health care provider for more information. WEIGHT AND DIET  Eat a healthy diet  Be sure to include plenty of vegetables, fruits, low-fat dairy products, and lean protein.  Do not eat a lot of foods high in solid fats, added sugars, or salt.  Get regular exercise. This is one of the most important things you can do for your health.  Most adults should exercise for at least 150 minutes each week. The exercise should increase your heart rate and make you sweat (moderate-intensity exercise).  Most adults should also do strengthening exercises at least twice a week. This is in addition to the moderate-intensity exercise.  Maintain a healthy weight  Body mass index (BMI) is a measurement that can be used to identify possible weight problems. It estimates body fat based on height and weight. Your health care provider can help determine your BMI and help you achieve or maintain a healthy weight.  For females 34 years of age and older:   A BMI below 18.5 is considered underweight.  A BMI of 18.5 to 24.9 is normal.  A BMI of 25 to 29.9 is considered overweight.  A BMI of 30 and above is considered obese.  Watch levels of cholesterol and blood lipids  You should start having your blood tested for lipids and cholesterol at 43 years of age, then have this test every 5 years.  You may need to  have your cholesterol levels checked more often if:  Your lipid or cholesterol levels are high.  You are older than 43 years of age.  You are at high risk for heart disease.  CANCER SCREENING   Lung Cancer  Lung cancer screening is recommended for adults 5-24 years old who are at high risk for lung cancer because of a history of smoking.  A yearly low-dose CT scan of the lungs is recommended for people who:  Currently smoke.  Have quit within the past 15 years.  Have at least a 30-pack-year history of smoking. A pack year is smoking an average of one pack of cigarettes a day for 1 year.  Yearly screening should continue until it has been 15 years since you quit.  Yearly screening should stop if you develop a health problem that would prevent you from having lung cancer treatment.  Breast Cancer  Practice breast self-awareness. This means understanding how your breasts normally appear and feel.  It also means doing regular breast self-exams. Let your health care provider know about any changes, no matter how small.  If you are in your 20s or 30s, you should have a clinical breast exam (CBE) by a health care provider every 1-3 years as part of a regular health exam.  If you are 74 or older, have a CBE every year. Also consider having a breast X-ray (mammogram) every year.  If you have a family history of  history of breast cancer, talk to your health care provider about genetic screening.  If you are at high risk for breast cancer, talk to your health care provider about having an MRI and a mammogram every year.  Breast cancer gene (BRCA) assessment is recommended for women who have family members with BRCA-related cancers. BRCA-related cancers include:  Breast.  Ovarian.  Tubal.  Peritoneal cancers.  Results of the assessment will determine the need for genetic counseling and BRCA1 and BRCA2 testing. Cervical Cancer Your health care provider may recommend that you be  screened regularly for cancer of the pelvic organs (ovaries, uterus, and vagina). This screening involves a pelvic examination, including checking for microscopic changes to the surface of your cervix (Pap test). You may be encouraged to have this screening done every 3 years, beginning at age 21.  For women ages 30-65, health care providers may recommend pelvic exams and Pap testing every 3 years, or they may recommend the Pap and pelvic exam, combined with testing for human papilloma virus (HPV), every 5 years. Some types of HPV increase your risk of cervical cancer. Testing for HPV may also be done on women of any age with unclear Pap test results.  Other health care providers may not recommend any screening for nonpregnant women who are considered low risk for pelvic cancer and who do not have symptoms. Ask your health care provider if a screening pelvic exam is right for you.  If you have had past treatment for cervical cancer or a condition that could lead to cancer, you need Pap tests and screening for cancer for at least 20 years after your treatment. If Pap tests have been discontinued, your risk factors (such as having a new sexual partner) need to be reassessed to determine if screening should resume. Some women have medical problems that increase the chance of getting cervical cancer. In these cases, your health care provider may recommend more frequent screening and Pap tests. Colorectal Cancer  This type of cancer can be detected and often prevented.  Routine colorectal cancer screening usually begins at 43 years of age and continues through 43 years of age.  Your health care provider may recommend screening at an earlier age if you have risk factors for colon cancer.  Your health care provider may also recommend using home test kits to check for hidden blood in the stool.  A small camera at the end of a tube can be used to examine your colon directly (sigmoidoscopy or colonoscopy).  This is done to check for the earliest forms of colorectal cancer.  Routine screening usually begins at age 50.  Direct examination of the colon should be repeated every 5-10 years through 43 years of age. However, you may need to be screened more often if early forms of precancerous polyps or small growths are found. Skin Cancer  Check your skin from head to toe regularly.  Tell your health care provider about any new moles or changes in moles, especially if there is a change in a mole's shape or color.  Also tell your health care provider if you have a mole that is larger than the size of a pencil eraser.  Always use sunscreen. Apply sunscreen liberally and repeatedly throughout the day.  Protect yourself by wearing long sleeves, pants, a wide-brimmed hat, and sunglasses whenever you are outside. HEART DISEASE, DIABETES, AND HIGH BLOOD PRESSURE   High blood pressure causes heart disease and increases the risk of stroke. High blood pressure   is more likely to develop in:  People who have blood pressure in the high end of the normal range (130-139/85-89 mm Hg).  People who are overweight or obese.  People who are African American.  If you are 18-39 years of age, have your blood pressure checked every 3-5 years. If you are 40 years of age or older, have your blood pressure checked every year. You should have your blood pressure measured twice--once when you are at a hospital or clinic, and once when you are not at a hospital or clinic. Record the average of the two measurements. To check your blood pressure when you are not at a hospital or clinic, you can use:  An automated blood pressure machine at a pharmacy.  A home blood pressure monitor.  If you are between 55 years and 79 years old, ask your health care provider if you should take aspirin to prevent strokes.  Have regular diabetes screenings. This involves taking a blood sample to check your fasting blood sugar level.  If you  are at a normal weight and have a low risk for diabetes, have this test once every three years after 43 years of age.  If you are overweight and have a high risk for diabetes, consider being tested at a younger age or more often. PREVENTING INFECTION  Hepatitis B  If you have a higher risk for hepatitis B, you should be screened for this virus. You are considered at high risk for hepatitis B if:  You were born in a country where hepatitis B is common. Ask your health care provider which countries are considered high risk.  Your parents were born in a high-risk country, and you have not been immunized against hepatitis B (hepatitis B vaccine).  You have HIV or AIDS.  You use needles to inject street drugs.  You live with someone who has hepatitis B.  You have had sex with someone who has hepatitis B.  You get hemodialysis treatment.  You take certain medicines for conditions, including cancer, organ transplantation, and autoimmune conditions. Hepatitis C  Blood testing is recommended for:  Everyone born from 1945 through 1965.  Anyone with known risk factors for hepatitis C. Sexually transmitted infections (STIs)  You should be screened for sexually transmitted infections (STIs) including gonorrhea and chlamydia if:  You are sexually active and are younger than 43 years of age.  You are older than 43 years of age and your health care provider tells you that you are at risk for this type of infection.  Your sexual activity has changed since you were last screened and you are at an increased risk for chlamydia or gonorrhea. Ask your health care provider if you are at risk.  If you do not have HIV, but are at risk, it may be recommended that you take a prescription medicine daily to prevent HIV infection. This is called pre-exposure prophylaxis (PrEP). You are considered at risk if:  You are sexually active and do not regularly use condoms or know the HIV status of your  partner(s).  You take drugs by injection.  You are sexually active with a partner who has HIV. Talk with your health care provider about whether you are at high risk of being infected with HIV. If you choose to begin PrEP, you should first be tested for HIV. You should then be tested every 3 months for as long as you are taking PrEP.  PREGNANCY   If you are premenopausal and   become pregnant, ask your health care provider about preconception counseling.  If you may become pregnant, take 400 to 800 micrograms (mcg) of folic acid every day.  If you want to prevent pregnancy, talk to your health care provider about birth control (contraception). OSTEOPOROSIS AND MENOPAUSE   Osteoporosis is a disease in which the bones lose minerals and strength with aging. This can result in serious bone fractures. Your risk for osteoporosis can be identified using a bone density scan.  If you are 46 years of age or older, or if you are at risk for osteoporosis and fractures, ask your health care provider if you should be screened.  Ask your health care provider whether you should take a calcium or vitamin D supplement to lower your risk for osteoporosis.  Menopause may have certain physical symptoms and risks.  Hormone replacement therapy may reduce some of these symptoms and risks. Talk to your health care provider about whether hormone replacement therapy is right for you.  HOME CARE INSTRUCTIONS   Schedule regular health, dental, and eye exams.  Stay current with your immunizations.   Do not use any tobacco products including cigarettes, chewing tobacco, or electronic cigarettes.  If you are pregnant, do not drink alcohol.  If you are breastfeeding, limit how much and how often you drink alcohol.  Limit alcohol intake to no more than 1 drink per day for nonpregnant women. One drink equals 12 ounces of beer, 5 ounces of wine, or 1 ounces of hard liquor.  Do not use street drugs.  Do  not share needles.  Ask your health care provider for help if you need support or information about quitting drugs.  Tell your health care provider if you often feel depressed.  Tell your health care provider if you have ever been abused or do not feel safe at home.   This information is not intended to replace advice given to you by your health care provider. Make sure you discuss any questions you have with your health care provider.   Document Released: 11/01/2010 Document Revised: 05/09/2014 Document Reviewed: 03/20/2013 Elsevier Interactive Patient Education [employer].

## 2015-11-19 NOTE — Assessment & Plan Note (Signed)
Checking labs, her tdap is up to date but she does not recall the date. Counseled on the need to avoid distracted driving. Counseled on routine exercise, non-smoker. Given screening recommendations. Colonoscopy due at 27.

## 2015-11-20 ENCOUNTER — Ambulatory Visit (INDEPENDENT_AMBULATORY_CARE_PROVIDER_SITE_OTHER): Payer: 59 | Admitting: *Deleted

## 2015-11-20 DIAGNOSIS — J309 Allergic rhinitis, unspecified: Secondary | ICD-10-CM

## 2015-11-26 ENCOUNTER — Ambulatory Visit (INDEPENDENT_AMBULATORY_CARE_PROVIDER_SITE_OTHER): Payer: 59 | Admitting: *Deleted

## 2015-11-26 DIAGNOSIS — J309 Allergic rhinitis, unspecified: Secondary | ICD-10-CM | POA: Diagnosis not present

## 2015-11-27 ENCOUNTER — Ambulatory Visit: Payer: 59

## 2015-12-04 ENCOUNTER — Ambulatory Visit (INDEPENDENT_AMBULATORY_CARE_PROVIDER_SITE_OTHER): Payer: 59 | Admitting: *Deleted

## 2015-12-04 DIAGNOSIS — J309 Allergic rhinitis, unspecified: Secondary | ICD-10-CM | POA: Diagnosis not present

## 2015-12-09 ENCOUNTER — Other Ambulatory Visit (INDEPENDENT_AMBULATORY_CARE_PROVIDER_SITE_OTHER): Payer: 59

## 2015-12-09 ENCOUNTER — Telehealth: Payer: Self-pay | Admitting: Geriatric Medicine

## 2015-12-09 DIAGNOSIS — R300 Dysuria: Secondary | ICD-10-CM

## 2015-12-09 DIAGNOSIS — B3749 Other urogenital candidiasis: Secondary | ICD-10-CM

## 2015-12-09 LAB — URINALYSIS, ROUTINE W REFLEX MICROSCOPIC
Bilirubin Urine: NEGATIVE
KETONES UR: NEGATIVE
NITRITE: NEGATIVE
Specific Gravity, Urine: <=1.005 — AB (ref 1.000–1.030)
TOTAL PROTEIN, URINE-UPE24: NEGATIVE
URINE GLUCOSE: NEGATIVE
UROBILINOGEN UA: 0.2 (ref 0.0–1.0)
pH: 6 (ref 5.0–8.0)

## 2015-12-09 MED ORDER — SULFAMETHOXAZOLE-TRIMETHOPRIM 800-160 MG PO TABS: 1.0000 | ORAL_TABLET | Freq: Two times a day (BID) | ORAL | 0 refills | Status: DC

## 2015-12-09 MED FILL — SULFAMETHOXAZOLE/TMP DS TAB: 800-160 | 5 days supply | Qty: 10 | Fill #0

## 2015-12-09 NOTE — Telephone Encounter (Signed)
We are not able to use this sample. Is she having any urinary symptoms? In the future she needs to contact us so we can handle as samples without proper processing cannot be used for treatment. Will place order for u/a for the lab.

## 2015-12-09 NOTE — Telephone Encounter (Signed)
Patient is having dysuria and frequent urination. She is going to leave a sample in the lab.

## 2015-12-09 NOTE — Telephone Encounter (Signed)
Her UA appears consistent with a possible UTI so I have sent in antibiotics to her pharmacy.

## 2015-12-09 NOTE — Telephone Encounter (Signed)
Ok thanks 

## 2015-12-09 NOTE — Telephone Encounter (Signed)
Marya Amsler, can you please review the u/a for this patient and send in an antibiotic if needed in Dr. Nathanial Millman absence, thanks.

## 2015-12-09 NOTE — Telephone Encounter (Signed)
Called and left a message on voice mail informing patient that abx have been called in.

## 2015-12-09 NOTE — Telephone Encounter (Signed)
Patient ran her urine in the lab and it showed positive for blood and wbc. I have a copy of the results. Would you be willing to call something in, or does she need an office visit.

## 2015-12-10 ENCOUNTER — Other Ambulatory Visit: Payer: Self-pay | Admitting: Internal Medicine

## 2015-12-11 ENCOUNTER — Ambulatory Visit (INDEPENDENT_AMBULATORY_CARE_PROVIDER_SITE_OTHER): Payer: 59

## 2015-12-11 DIAGNOSIS — J309 Allergic rhinitis, unspecified: Secondary | ICD-10-CM | POA: Diagnosis not present

## 2015-12-18 ENCOUNTER — Ambulatory Visit (INDEPENDENT_AMBULATORY_CARE_PROVIDER_SITE_OTHER): Payer: 59 | Admitting: *Deleted

## 2015-12-18 DIAGNOSIS — J309 Allergic rhinitis, unspecified: Secondary | ICD-10-CM | POA: Diagnosis not present

## 2015-12-25 ENCOUNTER — Ambulatory Visit: Payer: 59

## 2015-12-29 ENCOUNTER — Ambulatory Visit (INDEPENDENT_AMBULATORY_CARE_PROVIDER_SITE_OTHER): Payer: 59 | Admitting: *Deleted

## 2015-12-29 DIAGNOSIS — J309 Allergic rhinitis, unspecified: Secondary | ICD-10-CM

## 2016-01-01 ENCOUNTER — Ambulatory Visit: Payer: 59

## 2016-01-08 ENCOUNTER — Ambulatory Visit (INDEPENDENT_AMBULATORY_CARE_PROVIDER_SITE_OTHER): Payer: 59 | Admitting: *Deleted

## 2016-01-08 DIAGNOSIS — J309 Allergic rhinitis, unspecified: Secondary | ICD-10-CM

## 2016-01-15 ENCOUNTER — Ambulatory Visit (INDEPENDENT_AMBULATORY_CARE_PROVIDER_SITE_OTHER): Payer: 59 | Admitting: *Deleted

## 2016-01-15 DIAGNOSIS — J309 Allergic rhinitis, unspecified: Secondary | ICD-10-CM | POA: Diagnosis not present

## 2016-01-22 ENCOUNTER — Ambulatory Visit (INDEPENDENT_AMBULATORY_CARE_PROVIDER_SITE_OTHER): Payer: 59 | Admitting: *Deleted

## 2016-01-22 DIAGNOSIS — J309 Allergic rhinitis, unspecified: Secondary | ICD-10-CM | POA: Diagnosis not present

## 2016-01-29 ENCOUNTER — Ambulatory Visit (INDEPENDENT_AMBULATORY_CARE_PROVIDER_SITE_OTHER): Payer: 59 | Admitting: *Deleted

## 2016-01-29 DIAGNOSIS — J309 Allergic rhinitis, unspecified: Secondary | ICD-10-CM

## 2016-02-02 MED FILL — PRENAISSANCE PLUS SOFTGEL: 28-1-250 | 90 days supply | Qty: 90 | Fill #2

## 2016-02-05 ENCOUNTER — Ambulatory Visit (INDEPENDENT_AMBULATORY_CARE_PROVIDER_SITE_OTHER): Payer: 59 | Admitting: *Deleted

## 2016-02-05 DIAGNOSIS — J309 Allergic rhinitis, unspecified: Secondary | ICD-10-CM

## 2016-02-12 ENCOUNTER — Ambulatory Visit (INDEPENDENT_AMBULATORY_CARE_PROVIDER_SITE_OTHER): Payer: 59

## 2016-02-12 DIAGNOSIS — J309 Allergic rhinitis, unspecified: Secondary | ICD-10-CM

## 2016-02-19 ENCOUNTER — Ambulatory Visit (INDEPENDENT_AMBULATORY_CARE_PROVIDER_SITE_OTHER): Payer: 59 | Admitting: *Deleted

## 2016-02-19 DIAGNOSIS — J309 Allergic rhinitis, unspecified: Secondary | ICD-10-CM | POA: Diagnosis not present

## 2016-02-25 ENCOUNTER — Ambulatory Visit (INDEPENDENT_AMBULATORY_CARE_PROVIDER_SITE_OTHER): Payer: 59 | Admitting: *Deleted

## 2016-02-25 DIAGNOSIS — J309 Allergic rhinitis, unspecified: Secondary | ICD-10-CM | POA: Diagnosis not present

## 2016-02-26 ENCOUNTER — Ambulatory Visit: Payer: 59

## 2016-03-11 ENCOUNTER — Ambulatory Visit (INDEPENDENT_AMBULATORY_CARE_PROVIDER_SITE_OTHER): Payer: 59 | Admitting: *Deleted

## 2016-03-11 DIAGNOSIS — J309 Allergic rhinitis, unspecified: Secondary | ICD-10-CM | POA: Diagnosis not present

## 2016-03-18 ENCOUNTER — Ambulatory Visit (INDEPENDENT_AMBULATORY_CARE_PROVIDER_SITE_OTHER): Payer: 59 | Admitting: *Deleted

## 2016-03-18 DIAGNOSIS — J309 Allergic rhinitis, unspecified: Secondary | ICD-10-CM | POA: Diagnosis not present

## 2016-03-21 ENCOUNTER — Telehealth: Payer: Self-pay | Admitting: Internal Medicine

## 2016-03-21 DIAGNOSIS — Z01419 Encounter for gynecological examination (general) (routine) without abnormal findings: Secondary | ICD-10-CM | POA: Diagnosis not present

## 2016-03-21 DIAGNOSIS — Z6830 Body mass index (BMI) 30.0-30.9, adult: Secondary | ICD-10-CM | POA: Diagnosis not present

## 2016-03-21 DIAGNOSIS — E041 Nontoxic single thyroid nodule: Secondary | ICD-10-CM | POA: Diagnosis not present

## 2016-03-21 DIAGNOSIS — Z1151 Encounter for screening for human papillomavirus (HPV): Secondary | ICD-10-CM | POA: Diagnosis not present

## 2016-03-21 DIAGNOSIS — R350 Frequency of micturition: Secondary | ICD-10-CM | POA: Diagnosis not present

## 2016-03-21 DIAGNOSIS — J309 Allergic rhinitis, unspecified: Secondary | ICD-10-CM | POA: Diagnosis not present

## 2016-03-21 NOTE — Telephone Encounter (Signed)
Allergy Serum Extract Date Mixed: 03/21/16 Vial: 1 Strength: 1:10 Here/Mail/Pick Up: here Mixed By: tbs Last OV: 05/29/15 Pending OV: 05/30/16

## 2016-03-23 ENCOUNTER — Ambulatory Visit (INDEPENDENT_AMBULATORY_CARE_PROVIDER_SITE_OTHER): Payer: 59 | Admitting: *Deleted

## 2016-03-23 DIAGNOSIS — J309 Allergic rhinitis, unspecified: Secondary | ICD-10-CM

## 2016-03-30 ENCOUNTER — Other Ambulatory Visit: Payer: Self-pay | Admitting: Internal Medicine

## 2016-03-30 DIAGNOSIS — R51 Headache: Secondary | ICD-10-CM | POA: Diagnosis not present

## 2016-03-30 DIAGNOSIS — E049 Nontoxic goiter, unspecified: Secondary | ICD-10-CM | POA: Diagnosis not present

## 2016-03-30 DIAGNOSIS — E042 Nontoxic multinodular goiter: Secondary | ICD-10-CM | POA: Diagnosis not present

## 2016-04-01 ENCOUNTER — Ambulatory Visit (INDEPENDENT_AMBULATORY_CARE_PROVIDER_SITE_OTHER): Payer: 59 | Admitting: *Deleted

## 2016-04-01 DIAGNOSIS — J309 Allergic rhinitis, unspecified: Secondary | ICD-10-CM | POA: Diagnosis not present

## 2016-04-06 ENCOUNTER — Ambulatory Visit (HOSPITAL_COMMUNITY)
Admission: RE | Admit: 2016-04-06 | Discharge: 2016-04-06 | Disposition: A | Discharge status: Home / Self Care | Payer: 59 | Source: Ambulatory Visit | Attending: Internal Medicine | Admitting: Internal Medicine

## 2016-04-06 DIAGNOSIS — E042 Nontoxic multinodular goiter: Secondary | ICD-10-CM

## 2016-04-08 ENCOUNTER — Ambulatory Visit (INDEPENDENT_AMBULATORY_CARE_PROVIDER_SITE_OTHER): Payer: 59 | Admitting: *Deleted

## 2016-04-08 DIAGNOSIS — J309 Allergic rhinitis, unspecified: Secondary | ICD-10-CM | POA: Diagnosis not present

## 2016-04-13 ENCOUNTER — Telehealth: Payer: Self-pay

## 2016-04-13 NOTE — Telephone Encounter (Signed)
Patient states she is constantly itching, especially after showers in morning---has already tried lotions for dry skin but not helping---will continously itch all day----can you call something in for her?--please advise, I will call her back, thanks

## 2016-04-13 NOTE — Telephone Encounter (Signed)
Patient advised in person----she will try and call back if not working

## 2016-04-13 NOTE — Telephone Encounter (Signed)
She can try benadryl as needed. Another option is to take claritin 1 pill 3 times a day (note that this is a higher dose than for allergies).

## 2016-04-15 ENCOUNTER — Ambulatory Visit (INDEPENDENT_AMBULATORY_CARE_PROVIDER_SITE_OTHER): Payer: 59 | Admitting: *Deleted

## 2016-04-15 DIAGNOSIS — J309 Allergic rhinitis, unspecified: Secondary | ICD-10-CM | POA: Diagnosis not present

## 2016-04-20 MED FILL — FLUCONAZOLE 150 MG TABLET: 150 | 7 days supply | Qty: 3 | Fill #0

## 2016-04-21 DIAGNOSIS — E042 Nontoxic multinodular goiter: Secondary | ICD-10-CM | POA: Diagnosis not present

## 2016-04-22 ENCOUNTER — Ambulatory Visit: Payer: 59 | Admitting: *Deleted

## 2016-04-22 DIAGNOSIS — J309 Allergic rhinitis, unspecified: Secondary | ICD-10-CM

## 2016-04-29 ENCOUNTER — Ambulatory Visit (INDEPENDENT_AMBULATORY_CARE_PROVIDER_SITE_OTHER): Payer: 59 | Admitting: *Deleted

## 2016-04-29 DIAGNOSIS — J309 Allergic rhinitis, unspecified: Secondary | ICD-10-CM

## 2016-05-05 ENCOUNTER — Other Ambulatory Visit: Payer: Self-pay | Admitting: Internal Medicine

## 2016-05-05 MED FILL — VITAMIN D3 5,000 UNIT CAP: 125 MCG | 100 days supply | Qty: 100 | Fill #0

## 2016-05-06 ENCOUNTER — Ambulatory Visit (INDEPENDENT_AMBULATORY_CARE_PROVIDER_SITE_OTHER): Payer: 59 | Admitting: Allergy & Immunology

## 2016-05-06 ENCOUNTER — Telehealth: Payer: Self-pay | Admitting: Internal Medicine

## 2016-05-06 ENCOUNTER — Encounter: Payer: Self-pay | Admitting: Allergy & Immunology

## 2016-05-06 ENCOUNTER — Ambulatory Visit: Payer: Self-pay

## 2016-05-06 ENCOUNTER — Other Ambulatory Visit: Payer: 59

## 2016-05-06 VITALS — BP 120/70 | HR 64 | Temp 98.2°F | Resp 14 | Ht 62.5 in | Wt 163.0 lb

## 2016-05-06 DIAGNOSIS — J3089 Other allergic rhinitis: Secondary | ICD-10-CM

## 2016-05-06 DIAGNOSIS — R490 Dysphonia: Secondary | ICD-10-CM

## 2016-05-06 DIAGNOSIS — L501 Idiopathic urticaria: Secondary | ICD-10-CM

## 2016-05-06 DIAGNOSIS — L508 Other urticaria: Secondary | ICD-10-CM

## 2016-05-06 DIAGNOSIS — K219 Gastro-esophageal reflux disease without esophagitis: Secondary | ICD-10-CM | POA: Diagnosis not present

## 2016-05-06 DIAGNOSIS — J301 Allergic rhinitis due to pollen: Secondary | ICD-10-CM

## 2016-05-06 MED ORDER — EPINEPHRINE 0.3 MG/0.3ML IJ SOAJ
0.3000 mg | Freq: Once | INTRAMUSCULAR | 0 refills | Status: AC
Start: 2016-05-06 — End: 2016-05-06

## 2016-05-06 NOTE — Telephone Encounter (Signed)
Ok to place orders as requested for dx chronic urticaria

## 2016-05-06 NOTE — Telephone Encounter (Signed)
Spoke with pt, who states she was seen by Dr. Ernst Bowler today. Dr. Ernst Bowler has ordered a trptase and cp chronic urticaria index panel. Pt has requested that Dr. Annamaria Boots place these labs, so that pt can have these drawn downstairs. Pt can not have lab drawn downstairs, unless a [facility] doc orders it.   CY please advise. Thanks.

## 2016-05-06 NOTE — Telephone Encounter (Signed)
lmtcb for pt.  

## 2016-05-06 NOTE — Telephone Encounter (Signed)
Patient called back - her ext here in the lab is 366

## 2016-05-06 NOTE — Progress Notes (Signed)
NEW PATIENT  Date of Service/Encounter:  05/06/16   Assessment:   Chronic idiopathic urticaria  Chronic allergic rhinitis   Hoarse voice quality  Gastroesophageal reflux disease   Plan/Recommendations:    1. Chronic idiopathic urticaria - I recommended starting suppressive antihistamines dosing to control hives: cetirizine 10-20 mg in the morning and cetirizine 10-20 mg at night.  - We will get some lab work to rule out serious causes hives: serum tryptase and a chronic urticaria panel (a CBC has been normal in the past) - If you are getting sleepiness from the cetirizine, we can try other antihistamines.   2. Chronic nonseasonal allergic rhinitis due to pollen - We will remix her vials here using her blood testing from 2016. - Try using nasal saline followed by 1 spray of nasal steroid daily to help with the hoarseness.  - Sample of Qnasl 80 g provided.   3. Hoarse voice quality with concern for reflux - Start Dexilant 30 mg daily.  - Samples provided.  - Given [place] a call if this is working and we can send in a prescription.   4. Return in about 3 months (around 08/04/2016).     Subjective:   ELLEANA ARCEGA is a 46 y.o. female presenting today for evaluation of  Chief Complaint  Patient presents with  . New Evaluation    transferring from Dr. Bertrum Sol office. environmental allergies. does not want to do allergy testing again, but would like her injection today. says that she brought vials last week.   . Pruritis    has been itching all over for about 3 months. has been taking benadryl with some relief.     [name] has a history of the following: Patient Active Problem List   Diagnosis Date Noted  . Routine general medical examination at a health care facility 11/24/2014  . Seasonal and perennial allergic rhinitis 08/22/2014  . Aspirin allergy 08/22/2014    History obtained from: chart review and Patient.  [name] was referred by Hoyt Koch, MD.     Athena is a 46 y.o. female presenting for allergy evaluation. She is currently being followed by Dr. Annamaria Boots, who is cutting down in anticipation of retirement. She initially presented to him in June 2016. At that time, she was having spring and winter symptoms including symptoms triggered by perfumes and certain scents. Her symptoms included nasal congestion, postnasal drip, and rhinorrhea of clear mucus. She had blood allergy testing performed in April 2016 that was positive to dust mites, grass, trees, weeds, and ragweed with a total IgE 69. She was placed on allergy shots and is currently receiving 1 vial 0.5 mL every week. She has tried Flonase in the past without relief. She does not use a daily antihistamine.     When she first presented to Dr. Annamaria Boots, her complaint of hers was a hoarse voice. All of her other symptoms improved with the shots, but of course forces never improved. She reports that she awakens with a hoarse voice" sounds like a female until noon". This hoarse voice has been going on for years. She denies symptoms of reflux as well as chest pain. She has never been on a proton pump inhibitor or H2 blocker. Even with improvement in her postnasal drip, the hoarse voice has continued.  Romie does not have a history of asthma and has never needed an inhaler. She does report some shortness of breath when she first presented to Dr.  Young, but this improved with initiation of the immunotherapy. She does endorse itching which has been ongoing for 6 months. In association with the itching, she will occasionally get some hives. She has tried Benadryl with some improvement. She does not notice any particular trigger. She has never had fevers or permanent skin changes or joint involvement.  Kalyse does have a history of a reported aspirin allergy. She will receive aspirin as a child and would develop facial swelling. This continued until she was a teenager and she finally gave up using  aspirin completely. She was born in [place] and immigrated to the [place] in 1998 when she was 46 years old. He has no infectious history. She denies food allergies. She is able to tolerate all major food allergens without adverse event.  Otherwise, there is no history of other atopic diseases, including asthma, food allergies, or stinging insect allergies. There is no significant infectious history. Vaccinations are up to date.    Past Medical History: Patient Active Problem List   Diagnosis Date Noted  . Routine general medical examination at a health care facility 11/24/2014  . Seasonal and perennial allergic rhinitis 08/22/2014  . Aspirin allergy 08/22/2014    Medication List:  Allergies as of 05/06/2016      Reactions   Aspirin Swelling   Eye swelling      Medication List       Accurate as of 05/06/16 12:52 PM. Always use your most recent med list.          diphenhydrAMINE 25 MG tablet Commonly known as:  BENADRYL Take 25 mg by mouth every 6 (six) hours as needed.   NONFORMULARY OR COMPOUNDED ITEM Allergy Vaccine/Build up 1:5000/Gets Here   Vitamin D3 5000 units Caps Take 1 capsule (5,000 Units total) by mouth daily.       Birth History: non-contributory.   Developmental History: non-contributory.    Past Surgical History: Past Surgical History:  Procedure Laterality Date  . CESAREAN SECTION    . MYOMECTOMY       Family History: Family History  Problem Relation Age of Onset  . Arthritis Mother   . Allergies Father   . Allergies Paternal Grandfather      Social History: Janicia lives at home with Her husband at 23 year old twin boys. 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 They live in a 44 year old home with carpeting throughout. Abdomen electric heating. There are no animals inside or outside the home. They do not use dust mite covers on her bedding. There is no tobacco exposure.    Review of Systems: a 14-point review of systems is pertinent  for what is mentioned in HPI.  Otherwise, all other systems were negative. Constitutional: negative other than that listed in the HPI Eyes: negative other than that listed in the HPI Ears, nose, mouth, throat, and face: negative other than that listed in the HPI Respiratory: negative other than that listed in the HPI Cardiovascular: negative other than that listed in the HPI Gastrointestinal: negative other than that listed in the HPI Genitourinary: negative other than that listed in the HPI Integument: negative other than that listed in the HPI Hematologic: negative other than that listed in the HPI Musculoskeletal: negative other than that listed in the HPI Neurological: negative other than that listed in the HPI Allergy/Immunologic: negative other than that listed in the HPI    Objective:   Blood pressure 120/70, pulse 64, temperature 98.2 F (36.8 C), temperature source Oral, resp.  rate 14, height 5' 2.5" (1.588 m), weight 163 lb (73.9 kg), SpO2 98 %. Body mass index is 29.34 kg/m.   Physical Exam:  General: Alert, interactive, in no acute distress. Very pleasant and smiling throughout the exam. Friendly. Eyes: No conjunctival injection present on the right, No conjunctival injection present on the left, PERRL bilaterally, No discharge on the right, No discharge on the left and No Horner-Trantas dots present Ears: Right TM pearly gray with normal light reflex, Left TM pearly gray with normal light reflex, Right TM intact without perforation and Left TM intact without perforation.  Nose/Throat: External nose within normal limits, nasal crease present and septum midline, turbinates markedly edematous and pale with clear discharge, post-pharynx mildly erythematous without cobblestoning in the posterior oropharynx. Tonsils 2+ without exudates Neck: Supple without thyromegaly. Adenopathy: no enlarged lymph nodes appreciated in the anterior cervical, occipital, axillary, epitrochlear,  inguinal, or popliteal regions Lungs: Clear to auscultation without wheezing, rhonchi or rales. No increased work of breathing. CV: Normal S1/S2, no murmurs. Capillary refill <2 seconds.  Abdomen: Nondistended, nontender. No guarding or rebound tenderness. Bowel sounds present in all fields and hypoactive  Skin: Warm and dry, without lesions or rashes. Extremities:  No clubbing, cyanosis or edema. Neuro:   Grossly intact. No focal deficits appreciated. Responsive to questions.  Diagnostic studies: None     Salvatore Marvel, MD [facility]

## 2016-05-06 NOTE — Telephone Encounter (Signed)
Spoke with pt. And informed her of CY message. The orders were placed. Nothing further is needed at this time.

## 2016-05-06 NOTE — Telephone Encounter (Signed)
CY  I spoke with Ms. Shirley Wallace and informed her that you said it was ok to place the order. She said to tell you thank you and that she misses you.   Her ext 366 in lab

## 2016-05-06 NOTE — Patient Instructions (Addendum)
1. Chronic idiopathic urticaria - I recommend starting suppressive antihistamines dosing to control hives: Cetirizine 10-20 mg in the morning and cetirizine 10-20 mg at night.  - You can get cetirizine very chiefly on [employer]. - We will get some lab work to rule out serious causes hives: Serum tryptase and a chronic urticaria panel - If you are getting sleepiness from the cetirizine, we can try other antihistamines.   2. Chronic nonseasonal allergic rhinitis due to pollen - We will remix her vials here using her testing from a couple of years ago.  - Try using nasal saline followed by 1 spray of nasal steroid daily to help with the hoarseness.    3. Hoarse voice quality with concern for reflux - Start Dexilant 30 mg daily.  - Samples provided.  - Given [place] a call if this is working and we can send in a prescription.   4. Return in about 3 months (around 08/04/2016).  Please inform us of any Emergency Department visits, hospitalizations, or changes in symptoms. Call us before going to the ED for breathing or allergy symptoms since we might be able to fit you in for a sick visit. Feel free to contact us anytime with any questions, problems, or concerns.  It was a pleasure to meet you today! Best wishes in the [place] Year!   Websites that have reliable patient information: 1. American Academy of Asthma, Allergy, and Immunology: www.aaaai.org 2. Food Allergy Research and Education (FARE): foodallergy.org 3. Mothers of Asthmatics: http://www.asthmacommunitynetwork.org 4. American College of Allergy, Asthma, and Immunology: www.acaai.org

## 2016-05-10 LAB — TRYPTASE: TRYPTASE: 5.4 ug/L (ref <11)

## 2016-05-11 LAB — CP CHRONIC URTICARIA INDEX PANEL
TSH: 1.19 m[IU]/L
Thyroperoxidase Ab SerPl-aCnc: 3 IU/mL (ref <9)

## 2016-05-26 ENCOUNTER — Ambulatory Visit (INDEPENDENT_AMBULATORY_CARE_PROVIDER_SITE_OTHER): Payer: 59

## 2016-05-26 DIAGNOSIS — L501 Idiopathic urticaria: Secondary | ICD-10-CM

## 2016-05-30 ENCOUNTER — Ambulatory Visit: Payer: 59 | Admitting: Internal Medicine

## 2016-06-14 ENCOUNTER — Ambulatory Visit (INDEPENDENT_AMBULATORY_CARE_PROVIDER_SITE_OTHER): Payer: 59 | Admitting: *Deleted

## 2016-06-14 DIAGNOSIS — J309 Allergic rhinitis, unspecified: Secondary | ICD-10-CM | POA: Diagnosis not present

## 2016-06-28 ENCOUNTER — Ambulatory Visit (INDEPENDENT_AMBULATORY_CARE_PROVIDER_SITE_OTHER): Payer: 59 | Admitting: *Deleted

## 2016-06-28 DIAGNOSIS — J309 Allergic rhinitis, unspecified: Secondary | ICD-10-CM | POA: Diagnosis not present

## 2016-07-01 ENCOUNTER — Other Ambulatory Visit: Payer: Self-pay | Admitting: Internal Medicine

## 2016-07-01 ENCOUNTER — Telehealth: Payer: Self-pay | Admitting: *Deleted

## 2016-07-01 DIAGNOSIS — Z1231 Encounter for screening mammogram for malignant neoplasm of breast: Secondary | ICD-10-CM

## 2016-07-01 NOTE — Telephone Encounter (Signed)
PT WOULD LIKE A RETURN CALL REGARDING HER BILL.

## 2016-07-01 NOTE — Telephone Encounter (Signed)
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

## 2016-07-11 DIAGNOSIS — Z114 Encounter for screening for human immunodeficiency virus [HIV]: Secondary | ICD-10-CM | POA: Diagnosis not present

## 2016-07-11 DIAGNOSIS — Z113 Encounter for screening for infections with a predominantly sexual mode of transmission: Secondary | ICD-10-CM | POA: Diagnosis not present

## 2016-07-11 DIAGNOSIS — Z1159 Encounter for screening for other viral diseases: Secondary | ICD-10-CM | POA: Diagnosis not present

## 2016-07-11 DIAGNOSIS — Z118 Encounter for screening for other infectious and parasitic diseases: Secondary | ICD-10-CM | POA: Diagnosis not present

## 2016-07-11 DIAGNOSIS — Z3200 Encounter for pregnancy test, result unknown: Secondary | ICD-10-CM | POA: Diagnosis not present

## 2016-07-11 DIAGNOSIS — L988 Other specified disorders of the skin and subcutaneous tissue: Secondary | ICD-10-CM | POA: Diagnosis not present

## 2016-07-11 DIAGNOSIS — L989 Disorder of the skin and subcutaneous tissue, unspecified: Secondary | ICD-10-CM | POA: Diagnosis not present

## 2016-07-29 ENCOUNTER — Ambulatory Visit: Payer: 59

## 2016-08-02 ENCOUNTER — Ambulatory Visit
Admission: RE | Admit: 2016-08-02 | Discharge: 2016-08-02 | Disposition: A | Discharge status: Home / Self Care | Payer: 59 | Source: Ambulatory Visit | Attending: Internal Medicine | Admitting: Internal Medicine

## 2016-08-02 DIAGNOSIS — Z1231 Encounter for screening mammogram for malignant neoplasm of breast: Secondary | ICD-10-CM

## 2016-08-19 ENCOUNTER — Ambulatory Visit (INDEPENDENT_AMBULATORY_CARE_PROVIDER_SITE_OTHER): Payer: 59

## 2016-08-19 DIAGNOSIS — J309 Allergic rhinitis, unspecified: Secondary | ICD-10-CM | POA: Diagnosis not present

## 2016-08-25 ENCOUNTER — Encounter: Payer: Self-pay | Admitting: *Deleted

## 2016-08-25 DIAGNOSIS — J3089 Other allergic rhinitis: Secondary | ICD-10-CM | POA: Diagnosis not present

## 2016-08-25 NOTE — Addendum Note (Signed)
Addended by: Valentina Shaggy on: 08/25/2016 11:16 PM   Modules accepted: Orders

## 2016-08-26 ENCOUNTER — Ambulatory Visit (INDEPENDENT_AMBULATORY_CARE_PROVIDER_SITE_OTHER): Payer: 59

## 2016-08-26 DIAGNOSIS — J309 Allergic rhinitis, unspecified: Secondary | ICD-10-CM

## 2016-08-26 NOTE — Progress Notes (Signed)
Immunotherapy   Patient Details  Name: Shirley Wallace MRN: [id number] Date of Birth: 11/11/1970  08/26/2016  [name] here to pick up  her injections Patient is moving her care to [facility].    [name] 08/26/2016, 11:13 AM

## 2016-08-29 DIAGNOSIS — H524 Presbyopia: Secondary | ICD-10-CM | POA: Diagnosis not present

## 2016-09-05 NOTE — Progress Notes (Signed)
Immunotherapy   Patient Details  Name: Shirley Wallace MRN: [id number] Date of Birth: [birth year]  09/05/2016  Shirley Wallace 1:100 ( Dmite-Pollen)  were mailed out to [facility] Following schedule: A  Frequency:1 time per week Epi-Pen:Epi-Pen Available  Consent signed and patient instructions given.   Shirley Wallace 09/05/2016, 11:30 AM

## 2016-09-05 NOTE — Progress Notes (Unsigned)
Immunotherapy   Patient Details  Name: Shirley Wallace MRN: [id number] Date of Birth: 1971/04/03  09/05/2016  [name] {CHL AMB ALG REASON IMM [phone]} *** Following schedule: {ALLERGEN SCHEDULE:[phone]}  Frequency:{chl amb alg frequency imm:[phone]} Epi-Pen:{CHL AMB ALG EPI-PEN:[phone]} Consent signed and patient instructions given.   [name] 09/05/2016, 11:21 AM

## 2016-09-13 DIAGNOSIS — H1045 Other chronic allergic conjunctivitis: Secondary | ICD-10-CM | POA: Diagnosis not present

## 2016-09-13 DIAGNOSIS — J301 Allergic rhinitis due to pollen: Secondary | ICD-10-CM | POA: Diagnosis not present

## 2016-09-13 DIAGNOSIS — J3089 Other allergic rhinitis: Secondary | ICD-10-CM | POA: Diagnosis not present

## 2016-09-27 DIAGNOSIS — J301 Allergic rhinitis due to pollen: Secondary | ICD-10-CM | POA: Diagnosis not present

## 2016-09-27 DIAGNOSIS — J3089 Other allergic rhinitis: Secondary | ICD-10-CM | POA: Diagnosis not present

## 2016-09-27 DIAGNOSIS — S0502XA Injury of conjunctiva and corneal abrasion without foreign body, left eye, initial encounter: Secondary | ICD-10-CM | POA: Diagnosis not present

## 2016-09-30 DIAGNOSIS — J301 Allergic rhinitis due to pollen: Secondary | ICD-10-CM | POA: Diagnosis not present

## 2016-09-30 DIAGNOSIS — J3089 Other allergic rhinitis: Secondary | ICD-10-CM | POA: Diagnosis not present

## 2016-10-13 DIAGNOSIS — J3089 Other allergic rhinitis: Secondary | ICD-10-CM | POA: Diagnosis not present

## 2016-10-13 DIAGNOSIS — J301 Allergic rhinitis due to pollen: Secondary | ICD-10-CM | POA: Diagnosis not present

## 2016-10-18 DIAGNOSIS — J301 Allergic rhinitis due to pollen: Secondary | ICD-10-CM | POA: Diagnosis not present

## 2016-10-18 DIAGNOSIS — J3089 Other allergic rhinitis: Secondary | ICD-10-CM | POA: Diagnosis not present

## 2016-10-21 DIAGNOSIS — J3089 Other allergic rhinitis: Secondary | ICD-10-CM | POA: Diagnosis not present

## 2016-10-21 DIAGNOSIS — J301 Allergic rhinitis due to pollen: Secondary | ICD-10-CM | POA: Diagnosis not present

## 2016-10-28 DIAGNOSIS — J301 Allergic rhinitis due to pollen: Secondary | ICD-10-CM | POA: Diagnosis not present

## 2016-10-28 DIAGNOSIS — J3089 Other allergic rhinitis: Secondary | ICD-10-CM | POA: Diagnosis not present

## 2016-11-01 DIAGNOSIS — J301 Allergic rhinitis due to pollen: Secondary | ICD-10-CM | POA: Diagnosis not present

## 2016-11-01 DIAGNOSIS — J3089 Other allergic rhinitis: Secondary | ICD-10-CM | POA: Diagnosis not present

## 2016-11-07 DIAGNOSIS — J3089 Other allergic rhinitis: Secondary | ICD-10-CM | POA: Diagnosis not present

## 2016-11-07 DIAGNOSIS — J301 Allergic rhinitis due to pollen: Secondary | ICD-10-CM | POA: Diagnosis not present

## 2016-11-09 DIAGNOSIS — J301 Allergic rhinitis due to pollen: Secondary | ICD-10-CM | POA: Diagnosis not present

## 2016-11-09 DIAGNOSIS — J3089 Other allergic rhinitis: Secondary | ICD-10-CM | POA: Diagnosis not present

## 2016-11-15 DIAGNOSIS — J3089 Other allergic rhinitis: Secondary | ICD-10-CM | POA: Diagnosis not present

## 2016-11-15 DIAGNOSIS — J301 Allergic rhinitis due to pollen: Secondary | ICD-10-CM | POA: Diagnosis not present

## 2016-11-18 ENCOUNTER — Encounter: Payer: 59 | Admitting: Internal Medicine

## 2016-11-18 DIAGNOSIS — J3089 Other allergic rhinitis: Secondary | ICD-10-CM | POA: Diagnosis not present

## 2016-11-18 DIAGNOSIS — J301 Allergic rhinitis due to pollen: Secondary | ICD-10-CM | POA: Diagnosis not present

## 2016-11-22 DIAGNOSIS — J3089 Other allergic rhinitis: Secondary | ICD-10-CM | POA: Diagnosis not present

## 2016-11-22 DIAGNOSIS — J301 Allergic rhinitis due to pollen: Secondary | ICD-10-CM | POA: Diagnosis not present

## 2016-11-24 DIAGNOSIS — J3089 Other allergic rhinitis: Secondary | ICD-10-CM | POA: Diagnosis not present

## 2016-11-24 DIAGNOSIS — J301 Allergic rhinitis due to pollen: Secondary | ICD-10-CM | POA: Diagnosis not present

## 2016-11-25 ENCOUNTER — Encounter: Payer: Self-pay | Admitting: Nurse Practitioner

## 2016-11-25 ENCOUNTER — Ambulatory Visit (INDEPENDENT_AMBULATORY_CARE_PROVIDER_SITE_OTHER): Payer: 59 | Admitting: Nurse Practitioner

## 2016-11-25 VITALS — BP 112/74 | HR 68 | Temp 99.1°F | Ht 62.5 in | Wt 159.0 lb

## 2016-11-25 DIAGNOSIS — Z0001 Encounter for general adult medical examination with abnormal findings: Secondary | ICD-10-CM

## 2016-11-25 DIAGNOSIS — Z1211 Encounter for screening for malignant neoplasm of colon: Secondary | ICD-10-CM | POA: Diagnosis not present

## 2016-11-25 DIAGNOSIS — Z114 Encounter for screening for human immunodeficiency virus [HIV]: Secondary | ICD-10-CM | POA: Diagnosis not present

## 2016-11-25 DIAGNOSIS — Z23 Encounter for immunization: Secondary | ICD-10-CM | POA: Diagnosis not present

## 2016-11-25 DIAGNOSIS — K5900 Constipation, unspecified: Secondary | ICD-10-CM

## 2016-11-25 DIAGNOSIS — Z136 Encounter for screening for cardiovascular disorders: Secondary | ICD-10-CM | POA: Diagnosis not present

## 2016-11-25 DIAGNOSIS — N912 Amenorrhea, unspecified: Secondary | ICD-10-CM | POA: Diagnosis not present

## 2016-11-25 DIAGNOSIS — Z13220 Encounter for screening for lipoid disorders: Secondary | ICD-10-CM

## 2016-11-25 MED ORDER — LINACLOTIDE 145 MCG PO CAPS: 145.0000 ug | ORAL_CAPSULE | Freq: Every day | ORAL | 0 refills | Status: DC

## 2016-11-25 NOTE — Progress Notes (Signed)
Subjective:    Patient ID: Shirley Wallace, female    DOB: 1971-03-14, 46 y.o.   MRN: [id number]  Patient presents today for complete physical  HPI  Constipation: Chronic No improvement with increase fluid, fiber intake and OTC medication. No FH of colon cancer No hemorrhoid.  Anemorrhea: No menstrual cycle x 15yr, no FH of premature ovarian failure. Pelvic exam done by GYN 76months ago, normal per patient. norm  Immunizations: (TDAP, Hep C screen, Pneumovax, Influenza, zoster)  Health Maintenance  Topic Date Due  . HIV Screening  08/30/1987  . Tetanus Vaccine  08/30/1991  . Pap Smear  11/13/2016  . Flu Shot  11/30/2016   Diet:regular.  Weight:  Wt Readings from Last 3 Encounters:  11/25/16 159 lb (72.1 kg)  05/06/16 163 lb (73.9 kg)  11/19/15 164 lb 12.8 oz (74.8 kg)   Exercise:walking.  Fall Risk: Fall Risk  11/25/2016  Falls in the past year? No   Home Safety:home with husband and children.  Depression/Suicide: Depression screen [facility] 2/9 11/25/2016  Decreased Interest 0  Down, Depressed, Hopeless 0  PHQ - 2 Score 0   Pap Smear (every 89yrs for >21-29 without HPV, every 90yrs for >30-82yrs with HPV):deferred to GYN.  Vision:up to date.  Dental:up to date.  Sexual History (birth control, marital status, STD):married and sexually active.  Medications and allergies reviewed with patient and updated if appropriate.  Patient Active Problem List   Diagnosis Date Noted  . Routine general medical examination at a health care facility 11/24/2014  . Seasonal and perennial allergic rhinitis 08/22/2014  . Aspirin allergy 08/22/2014    Current Outpatient Prescriptions on File Prior to Visit  Medication Sig Dispense Refill  . Cholecalciferol (VITAMIN D3) 5000 units CAPS Take 1 capsule (5,000 Units total) by mouth daily. 90 capsule 1  . diphenhydrAMINE (BENADRYL) 25 MG tablet Take 25 mg by mouth every 6 (six) hours as needed.    . NONFORMULARY OR COMPOUNDED ITEM  Allergy Vaccine/Build up 1:5000/Gets Here     No current facility-administered medications on file prior to visit.     Past Medical History:  Diagnosis Date  . Allergy   . Arthritis   . Chronic headaches     Past Surgical History:  Procedure Laterality Date  . CESAREAN SECTION    . MYOMECTOMY      Social History   Social History  . Marital status: Married    Spouse name: N/A  . Number of children: 2  . Years of education: N/A   Occupational History  . lab tech    Social History Main Topics  . Smoking status: Never Smoker  . Smokeless tobacco: Never Used  . Alcohol use 1.2 oz/week    2 Shots of liquor per week     Comment: occasionally  . Drug use: No  . Sexual activity: Yes   Other Topics Concern  . None   Social History Narrative  . None    Family History  Problem Relation Age of Onset  . Arthritis Mother   . Allergies Father   . Allergies Paternal Grandfather         Review of Systems  Constitutional: Negative for fever, malaise/fatigue and weight loss.  HENT: Negative for congestion and sore throat.   Eyes:       Negative for visual changes  Respiratory: Negative for cough and shortness of breath.   Cardiovascular: Negative for chest pain, palpitations and leg swelling.  Gastrointestinal: Positive for constipation.  Negative for abdominal pain, blood in stool, diarrhea, heartburn, melena and nausea.  Genitourinary: Negative for dysuria, frequency and urgency.  Musculoskeletal: Negative for falls, joint pain and myalgias.  Skin: Negative for rash.  Neurological: Negative for dizziness, sensory change and headaches.  Endo/Heme/Allergies: Does not bruise/bleed easily.  Psychiatric/Behavioral: Negative for depression, substance abuse and suicidal ideas. The patient is not nervous/anxious and does not have insomnia.     Objective:   Vitals:   11/25/16 1131  BP: 112/74  Pulse: 68  Temp: 99.1 F (37.3 C)    Body mass index is 28.62  kg/m.   Physical Examination:  Physical Exam  Constitutional: She is oriented to person, place, and time and well-developed, well-nourished, and in no distress. No distress.  HENT:  Right Ear: External ear normal.  Left Ear: External ear normal.  Nose: Nose normal.  Mouth/Throat: Oropharynx is clear and moist. No oropharyngeal exudate.  Eyes: Pupils are equal, round, and reactive to light. Conjunctivae and EOM are normal. No scleral icterus.  Neck: Normal range of motion. Neck supple. No JVD present. No thyromegaly present.  Cardiovascular: Normal rate, normal heart sounds and intact distal pulses.   Pulmonary/Chest: Effort normal and breath sounds normal. She exhibits no tenderness.  Abdominal: Soft. Bowel sounds are normal. She exhibits no distension. There is no tenderness.  Genitourinary:  Genitourinary Comments: Breast and pelvic exam deferred to Gyn by patient  Musculoskeletal: Normal range of motion. She exhibits no edema or tenderness.  Lymphadenopathy:    She has no cervical adenopathy.  Neurological: She is alert and oriented to person, place, and time. No cranial nerve deficit. Gait normal. Coordination normal.  Skin: Skin is warm and dry.  Psychiatric: Affect and judgment normal.    ASSESSMENT and PLAN:  Shirley Wallace was seen today for annual exam.  Diagnoses and all orders for this visit:  Encounter for preventative adult health care exam with abnormal findings -     CBC; Future -     TSH; Future -     Comprehensive metabolic panel; Future -     Lipid panel; Future -     HIV antibody; Future  Amenorrhea -     FSH/LH; Future -     Prolactin; Future  Encounter for lipid screening for cardiovascular disease -     Lipid panel; Future  Constipation, unspecified constipation type -     linaclotide (LINZESS) 145 MCG CAPS capsule; Take 1 capsule (145 mcg total) by mouth daily before breakfast.  Colon cancer screening -     Ambulatory referral to  Gastroenterology  Encounter for screening for human immunodeficiency virus (HIV) -     HIV antibody; Future  Need for diphtheria-tetanus-pertussis (Tdap) vaccine -     Tdap vaccine greater than or equal to 7yo IM   No problem-specific Assessment & Plan notes found for this encounter.     Follow up: Return if symptoms worsen or fail to improve.  Wilfred Lacy, NP

## 2016-11-25 NOTE — Patient Instructions (Signed)
Please go to basement for blood draw. You will be called with results. You will be called to schedule appt with GI.  Please notify GYN about amenorrhea.  Constipation, Adult Constipation is when a person:  Poops (has a bowel movement) fewer times in a week than normal.  Has a hard time pooping.  Has poop that is dry, hard, or bigger than normal.  Follow these instructions at home: Eating and drinking   Eat foods that have a lot of fiber, such as: ? Fresh fruits and vegetables. ? Whole grains. ? Beans.  Eat less of foods that are high in fat, low in fiber, or overly processed, such as: ? [place] fries. ? Hamburgers. ? Cookies. ? Candy. ? Soda.  Drink enough fluid to keep your pee (urine) clear or pale yellow. General instructions  Exercise regularly or as told by your doctor.  Go to the restroom when you feel like you need to poop. Do not hold it in.  Take over-the-counter and prescription medicines only as told by your doctor. These include any fiber supplements.  Do pelvic floor retraining exercises, such as: ? Doing deep breathing while relaxing your lower belly (abdomen). ? Relaxing your pelvic floor while pooping.  Watch your condition for any changes.  Keep all follow-up visits as told by your doctor. This is important. Contact a doctor if:  You have pain that gets worse.  You have a fever.  You have not pooped for 4 days.  You throw up (vomit).  You are not hungry.  You lose weight.  You are bleeding from the anus.  You have thin, pencil-like poop (stool). Get help right away if:  You have a fever, and your symptoms suddenly get worse.  You leak poop or have blood in your poop.  Your belly feels hard or bigger than normal (is bloated).  You have very bad belly pain.  You feel dizzy or you faint. This information is not intended to replace advice given to you by your health care provider. Make sure you discuss any questions you have with  your health care provider. Document Released: 10/05/2007 Document Revised: 11/06/2015 Document Reviewed: 10/07/2015 Elsevier Interactive Patient Education  2017 [employer].

## 2016-11-29 DIAGNOSIS — Z114 Encounter for screening for human immunodeficiency virus [HIV]: Secondary | ICD-10-CM | POA: Diagnosis not present

## 2016-11-29 DIAGNOSIS — J301 Allergic rhinitis due to pollen: Secondary | ICD-10-CM | POA: Diagnosis not present

## 2016-11-29 DIAGNOSIS — N912 Amenorrhea, unspecified: Secondary | ICD-10-CM | POA: Diagnosis not present

## 2016-11-29 DIAGNOSIS — Z0001 Encounter for general adult medical examination with abnormal findings: Secondary | ICD-10-CM | POA: Diagnosis not present

## 2016-11-29 DIAGNOSIS — J3089 Other allergic rhinitis: Secondary | ICD-10-CM | POA: Diagnosis not present

## 2016-11-29 LAB — COMPREHENSIVE METABOLIC PANEL
ALBUMIN: 4.3 g/dL (ref 3.5–5.2)
ALK PHOS: 58 U/L (ref 39–117)
ALT: 21 U/L (ref 0–35)
AST: 19 U/L (ref 0–37)
BUN: 13 mg/dL (ref 6–23)
CALCIUM: 9.2 mg/dL (ref 8.4–10.5)
CO2: 32 mEq/L (ref 19–32)
Chloride: 102 mEq/L (ref 96–112)
Creatinine, Ser: 0.82 mg/dL (ref 0.40–1.20)
GFR: 97.28 mL/min (ref >60.00)
Glucose, Bld: 94 mg/dL (ref 70–99)
POTASSIUM: 3.5 meq/L (ref 3.5–5.1)
SODIUM: 140 meq/L (ref 135–145)
TOTAL PROTEIN: 7.3 g/dL (ref 6.0–8.3)
Total Bilirubin: 0.4 mg/dL (ref 0.2–1.2)

## 2016-11-29 LAB — LIPID PANEL
CHOLESTEROL: 212 mg/dL — AB (ref 0–200)
HDL: 53.2 mg/dL (ref >39.00)
LDL Cholesterol: 133 mg/dL — ABNORMAL HIGH (ref 0–99)
NonHDL: 158.34
TRIGLYCERIDES: 126 mg/dL (ref 0.0–149.0)
Total CHOL/HDL Ratio: 4
VLDL: 25.2 mg/dL (ref 0.0–40.0)

## 2016-11-29 LAB — CBC
HEMATOCRIT: 38.1 % (ref 36.0–46.0)
HEMOGLOBIN: 12.7 g/dL (ref 12.0–15.0)
MCHC: 33.4 g/dL (ref 30.0–36.0)
MCV: 81.6 fl (ref 78.0–100.0)
PLATELETS: 308 10*3/uL (ref 150.0–400.0)
RBC: 4.66 Mil/uL (ref 3.87–5.11)
RDW: 12.9 % (ref 11.5–15.5)
WBC: 5.2 10*3/uL (ref 4.0–10.5)

## 2016-11-29 LAB — TSH: TSH: 1.15 u[IU]/mL (ref 0.35–4.50)

## 2016-11-29 NOTE — Addendum Note (Signed)
Addended by: Tomi Likens on: 11/29/2016 08:50 AM   Modules accepted: Orders

## 2016-11-30 LAB — FSH/LH
FSH: 48.2 m[IU]/mL
LH: 28.7 m[IU]/mL

## 2016-11-30 LAB — HIV ANTIBODY (ROUTINE TESTING W REFLEX): HIV 1&2 Ab, 4th Generation: NONREACTIVE

## 2016-11-30 LAB — PROLACTIN: Prolactin: 2.6 ng/mL

## 2016-12-01 DIAGNOSIS — J3089 Other allergic rhinitis: Secondary | ICD-10-CM | POA: Diagnosis not present

## 2016-12-01 DIAGNOSIS — J301 Allergic rhinitis due to pollen: Secondary | ICD-10-CM | POA: Diagnosis not present

## 2016-12-05 DIAGNOSIS — J3089 Other allergic rhinitis: Secondary | ICD-10-CM | POA: Diagnosis not present

## 2016-12-05 DIAGNOSIS — J301 Allergic rhinitis due to pollen: Secondary | ICD-10-CM | POA: Diagnosis not present

## 2016-12-06 MED FILL — VITAMIN D3 5,000 UNIT CAP: 125 MCG | 80 days supply | Qty: 80 | Fill #1 | Status: TO

## 2016-12-06 MED FILL — PRENAISSANCE PLUS SOFTGEL: 28-1-250 | 90 days supply | Qty: 90 | Fill #0 | Status: TO

## 2016-12-08 DIAGNOSIS — J3089 Other allergic rhinitis: Secondary | ICD-10-CM | POA: Diagnosis not present

## 2016-12-08 DIAGNOSIS — J301 Allergic rhinitis due to pollen: Secondary | ICD-10-CM | POA: Diagnosis not present

## 2016-12-14 DIAGNOSIS — J3089 Other allergic rhinitis: Secondary | ICD-10-CM | POA: Diagnosis not present

## 2016-12-14 DIAGNOSIS — J301 Allergic rhinitis due to pollen: Secondary | ICD-10-CM | POA: Diagnosis not present

## 2016-12-16 DIAGNOSIS — J3089 Other allergic rhinitis: Secondary | ICD-10-CM | POA: Diagnosis not present

## 2016-12-16 DIAGNOSIS — J301 Allergic rhinitis due to pollen: Secondary | ICD-10-CM | POA: Diagnosis not present

## 2016-12-20 DIAGNOSIS — J301 Allergic rhinitis due to pollen: Secondary | ICD-10-CM | POA: Diagnosis not present

## 2016-12-20 DIAGNOSIS — J3089 Other allergic rhinitis: Secondary | ICD-10-CM | POA: Diagnosis not present

## 2016-12-23 DIAGNOSIS — J3089 Other allergic rhinitis: Secondary | ICD-10-CM | POA: Diagnosis not present

## 2016-12-23 DIAGNOSIS — J301 Allergic rhinitis due to pollen: Secondary | ICD-10-CM | POA: Diagnosis not present

## 2016-12-28 DIAGNOSIS — J3089 Other allergic rhinitis: Secondary | ICD-10-CM | POA: Diagnosis not present

## 2016-12-28 DIAGNOSIS — J301 Allergic rhinitis due to pollen: Secondary | ICD-10-CM | POA: Diagnosis not present

## 2017-01-03 DIAGNOSIS — J301 Allergic rhinitis due to pollen: Secondary | ICD-10-CM | POA: Diagnosis not present

## 2017-01-03 DIAGNOSIS — J3089 Other allergic rhinitis: Secondary | ICD-10-CM | POA: Diagnosis not present

## 2017-01-11 DIAGNOSIS — J301 Allergic rhinitis due to pollen: Secondary | ICD-10-CM | POA: Diagnosis not present

## 2017-01-11 DIAGNOSIS — J3089 Other allergic rhinitis: Secondary | ICD-10-CM | POA: Diagnosis not present

## 2017-01-18 ENCOUNTER — Encounter: Payer: 59 | Admitting: Internal Medicine

## 2017-01-18 DIAGNOSIS — J3089 Other allergic rhinitis: Secondary | ICD-10-CM | POA: Diagnosis not present

## 2017-01-18 DIAGNOSIS — J301 Allergic rhinitis due to pollen: Secondary | ICD-10-CM | POA: Diagnosis not present

## 2017-01-25 DIAGNOSIS — J301 Allergic rhinitis due to pollen: Secondary | ICD-10-CM | POA: Diagnosis not present

## 2017-01-25 DIAGNOSIS — J3089 Other allergic rhinitis: Secondary | ICD-10-CM | POA: Diagnosis not present

## 2017-01-26 DIAGNOSIS — L989 Disorder of the skin and subcutaneous tissue, unspecified: Secondary | ICD-10-CM | POA: Diagnosis not present

## 2017-01-27 ENCOUNTER — Encounter: Payer: Self-pay | Admitting: Nurse Practitioner

## 2017-01-31 ENCOUNTER — Encounter: Payer: Self-pay | Admitting: Nurse Practitioner

## 2017-01-31 ENCOUNTER — Ambulatory Visit (INDEPENDENT_AMBULATORY_CARE_PROVIDER_SITE_OTHER): Payer: 59 | Admitting: Nurse Practitioner

## 2017-01-31 VITALS — BP 120/78 | HR 69 | Temp 98.3°F | Ht 62.5 in | Wt 159.0 lb

## 2017-01-31 DIAGNOSIS — B354 Tinea corporis: Secondary | ICD-10-CM

## 2017-01-31 MED ORDER — TRIAMCINOLONE ACETONIDE 0.5 % EX OINT: 1.0000 "application " | TOPICAL_OINTMENT | Freq: Two times a day (BID) | CUTANEOUS | 1 refills | Status: AC

## 2017-01-31 MED ORDER — FLUCONAZOLE 150 MG PO TABS: 150.0000 mg | ORAL_TABLET | ORAL | 0 refills | Status: DC

## 2017-01-31 MED ORDER — KETOCONAZOLE 2 % EX CREA: 1.0000 "application " | TOPICAL_CREAM | Freq: Every day | CUTANEOUS | 1 refills | Status: DC

## 2017-01-31 MED ORDER — KETOCONAZOLE 2 % EX CREA: 1.0000 "application " | TOPICAL_CREAM | Freq: Every day | CUTANEOUS | 0 refills | Status: DC

## 2017-01-31 MED ORDER — TRIAMCINOLONE ACETONIDE 0.5 % EX OINT: 1.0000 "application " | TOPICAL_OINTMENT | Freq: Two times a day (BID) | CUTANEOUS | 0 refills | Status: DC

## 2017-01-31 MED FILL — FLUCONAZOLE 150 MG TABLET: 150 | 14 days supply | Qty: 2 | Fill #0

## 2017-01-31 MED FILL — TRIAMCINOLONE 0.5% OINTMENT: 0.5 | 10 days supply | Qty: 30 | Fill #0

## 2017-01-31 MED FILL — KETOCONAZOLE 2% CREAM: 2 | 10 days supply | Qty: 15 | Fill #0

## 2017-01-31 NOTE — Patient Instructions (Signed)

## 2017-01-31 NOTE — Progress Notes (Signed)
Subjective:  Patient ID: Shirley Wallace, female    DOB: 22-Nov-1970  Age: 46 y.o. MRN: [id number]  CC: Rash (spot on abdominal and back area,itching. thinking its the aspirin?)   Rash  This is a new problem. The current episode started 1 to 4 weeks ago. The problem has been gradually worsening since onset. The affected locations include the torso. The rash is characterized by scaling, itchiness and dryness. It is unknown if there was an exposure to a precipitant. Pertinent negatives include no congestion, facial edema, fatigue, fever, joint pain, nail changes or sore throat. Past treatments include nothing.    Outpatient Medications Prior to Visit  Medication Sig Dispense Refill  . AUVI-Q 0.3 MG/0.3ML SOAJ injection     . Cholecalciferol (VITAMIN D3) 5000 units CAPS Take 1 capsule (5,000 Units total) by mouth daily. 90 capsule 1  . NONFORMULARY OR COMPOUNDED ITEM Allergy Vaccine/Build up 1:5000/Gets Here    . diphenhydrAMINE (BENADRYL) 25 MG tablet Take 25 mg by mouth every 6 (six) hours as needed.    . linaclotide (LINZESS) 145 MCG CAPS capsule Take 1 capsule (145 mcg total) by mouth daily before breakfast. (Patient not taking: Reported on 01/31/2017) 14 capsule 0  . Multiple Vitamin (MULTIVITAMIN) capsule Take 1 capsule by mouth daily.     No facility-administered medications prior to visit.     ROS See HPI  Objective:  BP 120/78   Pulse 69   Temp 98.3 F (36.8 C)   Ht 5' 2.5" (1.588 m)   Wt 159 lb (72.1 kg)   SpO2 99%   BMI 28.62 kg/m   BP Readings from Last 3 Encounters:  01/31/17 120/78  11/25/16 112/74  05/06/16 120/70    Wt Readings from Last 3 Encounters:  01/31/17 159 lb (72.1 kg)  11/25/16 159 lb (72.1 kg)  05/06/16 163 lb (73.9 kg)    Physical Exam  Constitutional: She is oriented to person, place, and time. No distress.  Cardiovascular: Normal rate.   Pulmonary/Chest: Effort normal.  Neurological: She is alert and oriented to person, place, and time.    Skin: Skin is warm and dry. Rash noted. No erythema.  Discrete annular scaly lesions on torso with central clearing.  Psychiatric: She has a normal mood and affect. Her behavior is normal.  Vitals reviewed.   Lab Results  Component Value Date   WBC 5.2 11/29/2016   HGB 12.7 11/29/2016   HCT 38.1 11/29/2016   PLT 308.0 11/29/2016   GLUCOSE 94 11/29/2016   CHOL 212 (H) 11/29/2016   TRIG 126.0 11/29/2016   HDL 53.20 11/29/2016   LDLCALC 133 (H) 11/29/2016   ALT 21 11/29/2016   AST 19 11/29/2016   NA 140 11/29/2016   K 3.5 11/29/2016   CL 102 11/29/2016   CREATININE 0.82 11/29/2016   BUN 13 11/29/2016   CO2 32 11/29/2016   TSH 1.15 11/29/2016    Mm Screening Breast Tomo Bilateral  Result Date: 08/02/2016 CLINICAL DATA:  Screening. EXAM: 2D DIGITAL SCREENING BILATERAL MAMMOGRAM WITH CAD AND ADJUNCT TOMO COMPARISON:  Previous exam(s). ACR Breast Density Category c: The breast tissue is heterogeneously dense, which may obscure small masses. FINDINGS: There are no findings suspicious for malignancy. Images were processed with CAD. IMPRESSION: No mammographic evidence of malignancy. A result letter of this screening mammogram will be mailed directly to the patient. RECOMMENDATION: Screening mammogram in one year. (Code:SM-B-01Y) BI-RADS CATEGORY  1: Negative. Electronically Signed   By: Ammie Ferrier M.D.  On: 08/02/2016 16:21    Assessment & Plan:   Shirley Wallace was seen today for rash.  Diagnoses and all orders for this visit:  Tinea corporis -     Discontinue: ketoconazole (NIZORAL) 2 % cream; Apply 1 application topically daily. -     Discontinue: triamcinolone ointment (KENALOG) 0.5 %; Apply 1 application topically 2 (two) times daily. -     fluconazole (DIFLUCAN) 150 MG tablet; Take 1 tablet (150 mg total) by mouth once a week. -     ketoconazole (NIZORAL) 2 % cream; Apply 1 application topically daily. -     triamcinolone ointment (KENALOG) 0.5 %; Apply 1 application  topically 2 (two) times daily.   I am having Shirley Wallace start on fluconazole. I am also having her maintain her NONFORMULARY OR COMPOUNDED ITEM, Vitamin D3, diphenhydrAMINE, AUVI-Q, multivitamin, linaclotide, PRENAISSANCE PLUS, Melatonin, folic acid, Nutritional Supplements (JUICE PLUS FIBRE PO), ketoconazole, and triamcinolone ointment.  Meds ordered this encounter  Medications  . Prenat w/o A-FeCbn-DSS-FA-DHA (PRENAISSANCE PLUS) 28-1-250 MG CAPS    Refill:  1  . Melatonin 5 MG TABS    Sig: Take by mouth.  . folic acid (FOLVITE) 409 MCG tablet    Sig: Take 400 mcg by mouth daily.  . Nutritional Supplements (JUICE PLUS FIBRE PO)    Sig: Take by mouth.  . DISCONTD: ketoconazole (NIZORAL) 2 % cream    Sig: Apply 1 application topically daily.    Dispense:  15 g    Refill:  0    Order Specific Question:   Supervising Provider    Answer:   Cassandria Anger [[id number]]  . DISCONTD: triamcinolone ointment (KENALOG) 0.5 %    Sig: Apply 1 application topically 2 (two) times daily.    Dispense:  30 g    Refill:  0    Order Specific Question:   Supervising Provider    Answer:   Cassandria Anger [[id number]]  . fluconazole (DIFLUCAN) 150 MG tablet    Sig: Take 1 tablet (150 mg total) by mouth once a week.    Dispense:  2 tablet    Refill:  0    Order Specific Question:   Supervising Provider    Answer:   Cassandria Anger [[id number]]  . ketoconazole (NIZORAL) 2 % cream    Sig: Apply 1 application topically daily.    Dispense:  15 g    Refill:  1    Order Specific Question:   Supervising Provider    Answer:   Cassandria Anger [[id number]]  . triamcinolone ointment (KENALOG) 0.5 %    Sig: Apply 1 application topically 2 (two) times daily.    Dispense:  30 g    Refill:  1    Order Specific Question:   Supervising Provider    Answer:   Cassandria Anger [[id number]]    Follow-up: Return if symptoms worsen or fail to improve.  Wilfred Lacy, NP

## 2017-02-01 ENCOUNTER — Telehealth: Payer: Self-pay | Admitting: Emergency Medicine

## 2017-02-01 NOTE — Telephone Encounter (Signed)
She sill need to make appt with Dr. Sharlet Salina to evaluate this

## 2017-02-01 NOTE — Telephone Encounter (Signed)
Pt called and stated Baldo Ash saw her yesterday. She forgot to tell you about something and asked that you give her a call back. Please advise thanks.

## 2017-02-01 NOTE — Telephone Encounter (Signed)
Pt stated she forgot to mention, she has been having a hard time concentrating or focusing at work. She was wondering if [place] can give her something to help. Advise pt she might need an ov for this.

## 2017-02-02 DIAGNOSIS — J301 Allergic rhinitis due to pollen: Secondary | ICD-10-CM | POA: Diagnosis not present

## 2017-02-02 DIAGNOSIS — J3089 Other allergic rhinitis: Secondary | ICD-10-CM | POA: Diagnosis not present

## 2017-02-02 NOTE — Telephone Encounter (Signed)
Left vm inform pt of massage below.

## 2017-02-07 DIAGNOSIS — J301 Allergic rhinitis due to pollen: Secondary | ICD-10-CM | POA: Diagnosis not present

## 2017-02-07 DIAGNOSIS — J3089 Other allergic rhinitis: Secondary | ICD-10-CM | POA: Diagnosis not present

## 2017-02-08 DIAGNOSIS — J3089 Other allergic rhinitis: Secondary | ICD-10-CM | POA: Diagnosis not present

## 2017-02-08 DIAGNOSIS — J301 Allergic rhinitis due to pollen: Secondary | ICD-10-CM | POA: Diagnosis not present

## 2017-02-14 DIAGNOSIS — J3089 Other allergic rhinitis: Secondary | ICD-10-CM | POA: Diagnosis not present

## 2017-02-14 DIAGNOSIS — J301 Allergic rhinitis due to pollen: Secondary | ICD-10-CM | POA: Diagnosis not present

## 2017-03-02 DIAGNOSIS — J301 Allergic rhinitis due to pollen: Secondary | ICD-10-CM | POA: Diagnosis not present

## 2017-03-02 DIAGNOSIS — J3089 Other allergic rhinitis: Secondary | ICD-10-CM | POA: Diagnosis not present

## 2017-03-09 DIAGNOSIS — J3089 Other allergic rhinitis: Secondary | ICD-10-CM | POA: Diagnosis not present

## 2017-03-09 DIAGNOSIS — J301 Allergic rhinitis due to pollen: Secondary | ICD-10-CM | POA: Diagnosis not present

## 2017-03-16 IMAGING — US US THYROID
1 series · 13 of 25 positions shown · non-contrast
Comparison: 06/09/2003

CLINICAL DATA: Goiter.  Multinodular nontoxic goiter.

EXAM:
THYROID ULTRASOUND
TECHNIQUE: Ultrasound examination of the thyroid gland and adjacent soft
tissues was performed.

[Series 1: us thyroid · 0.06mm/px · 13 of 40 slices shown]
[im 1/40]
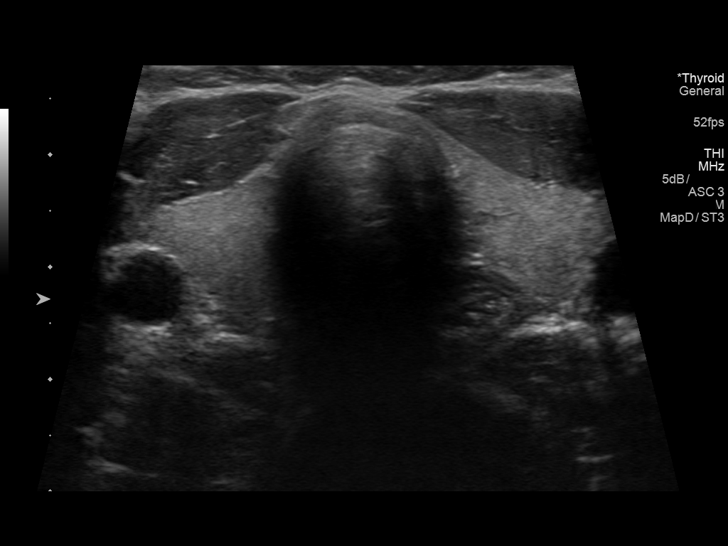
[im 4/40]
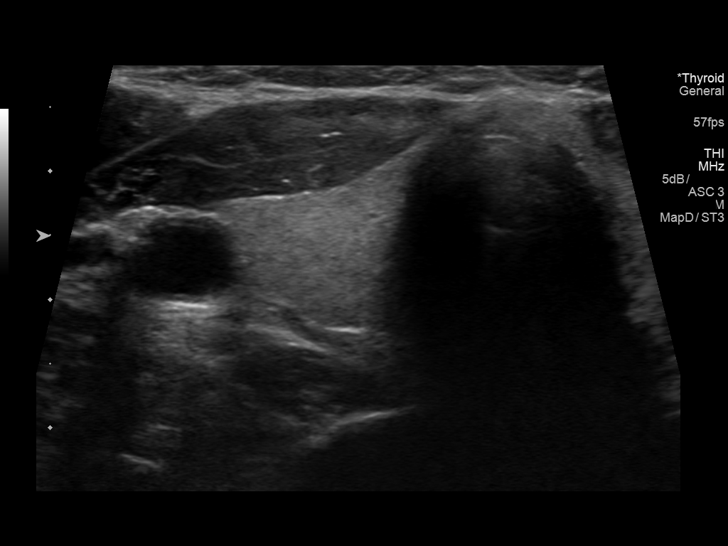
[im 7/40]
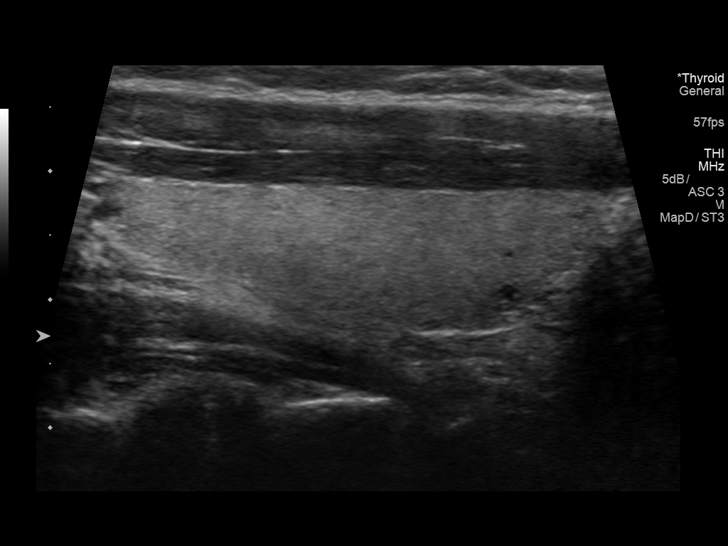
[im 10/40]
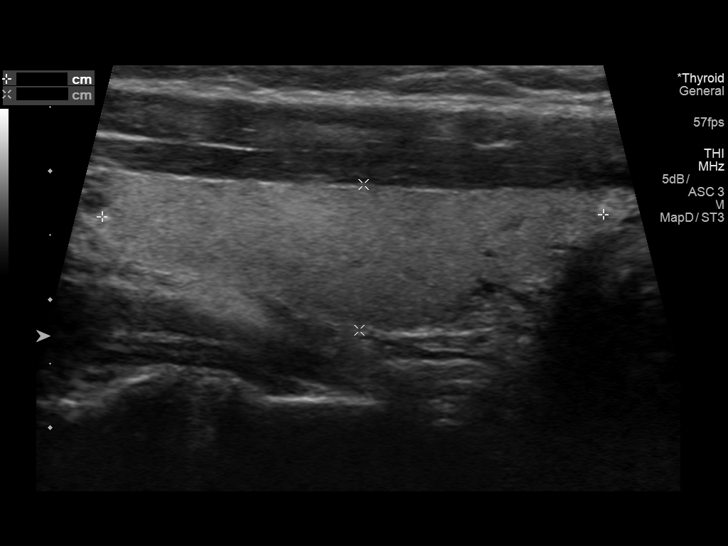
[im 14/40]
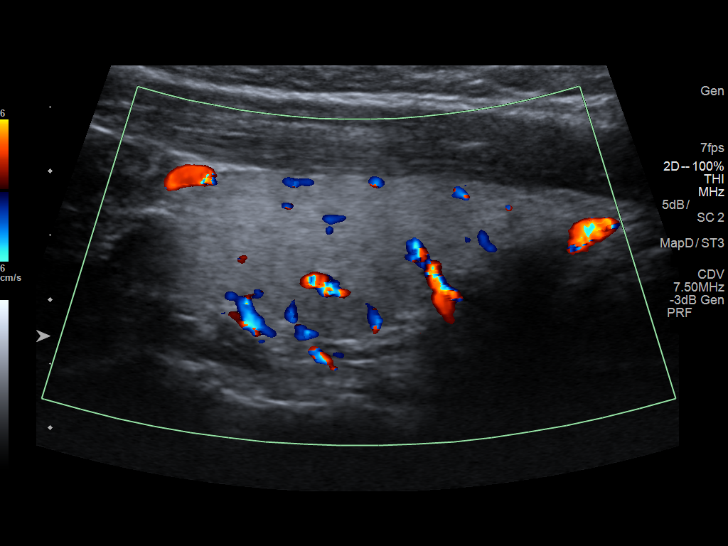
[im 17/40]
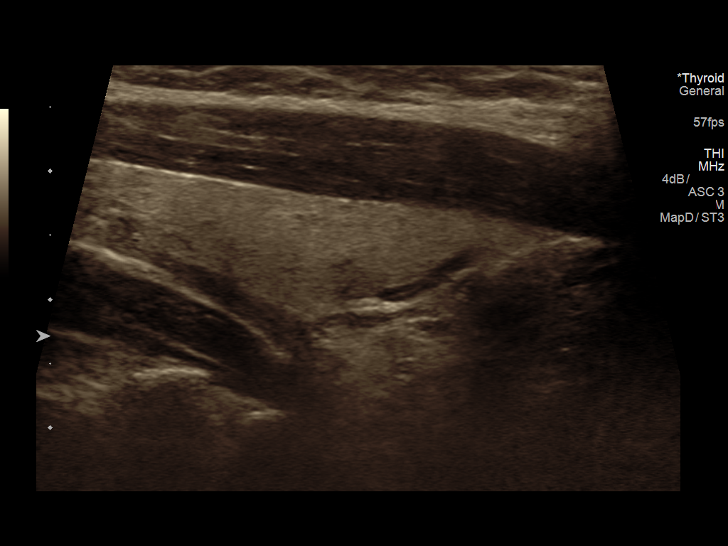
[im 20/40]
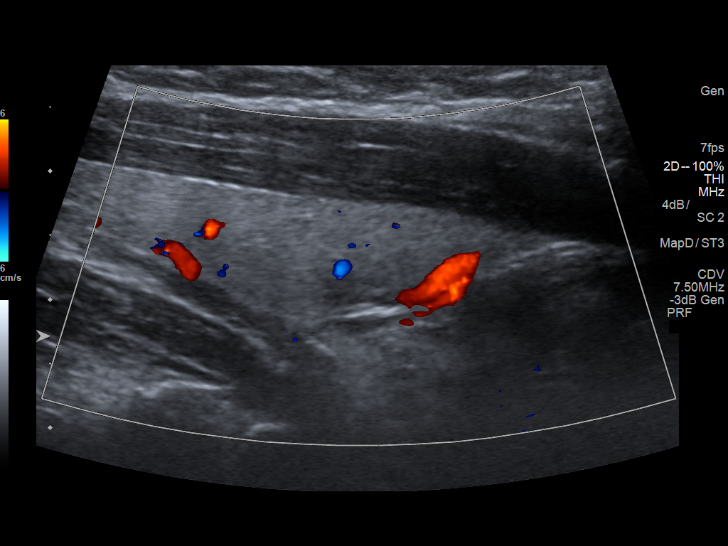
[im 23/40]
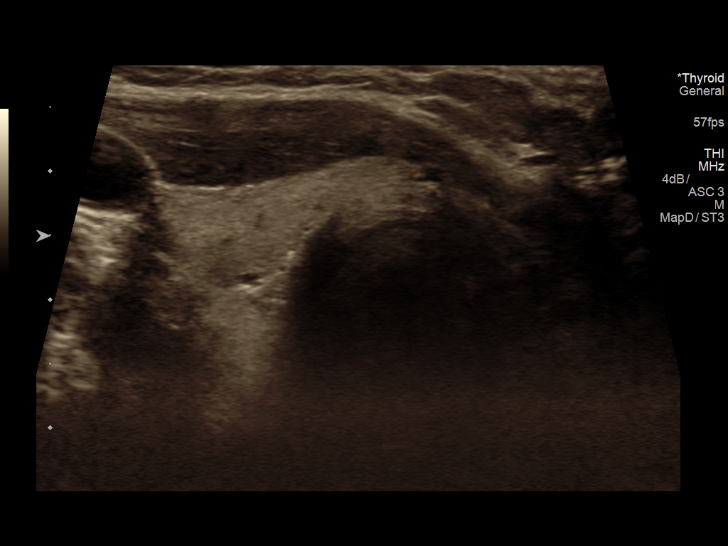
[im 27/40]
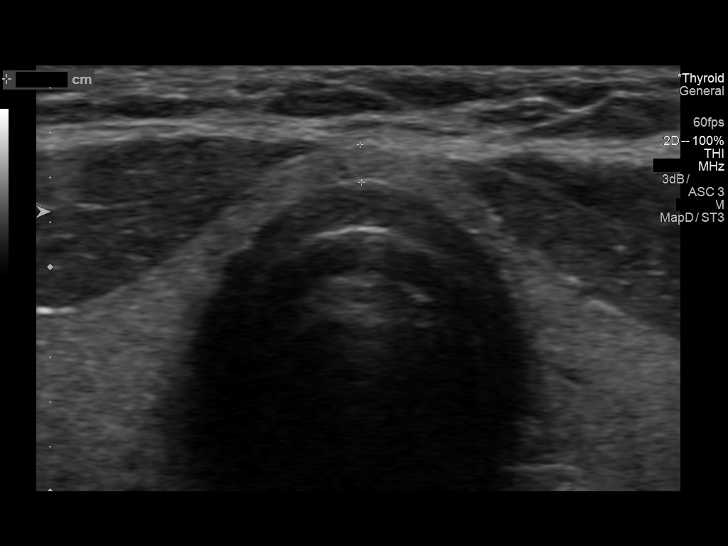
[im 30/40]
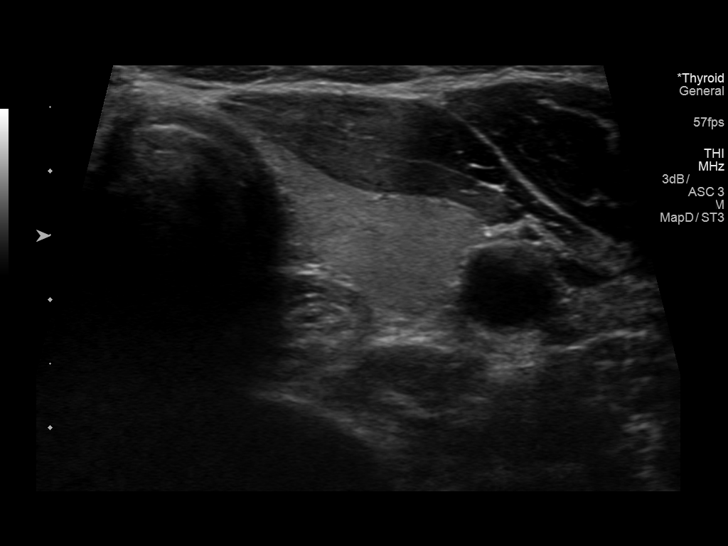
[im 33/40]
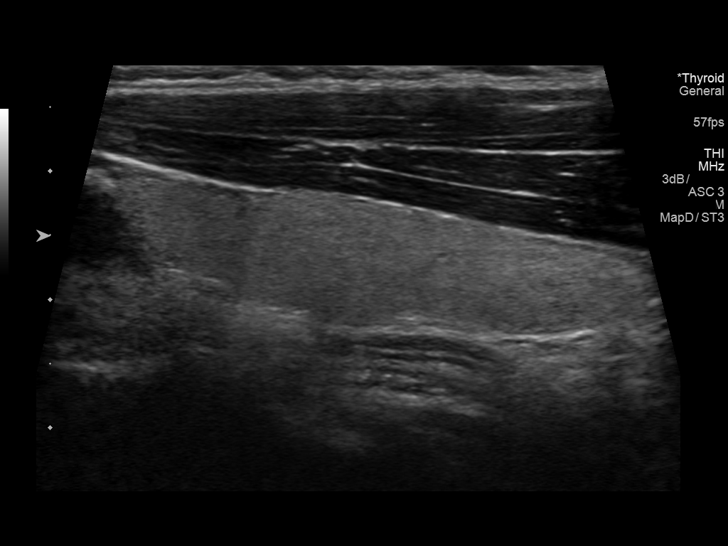
[im 36/40]
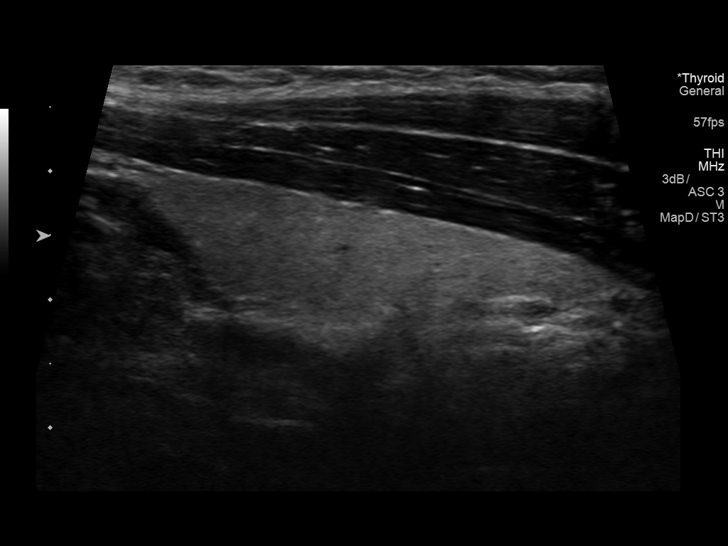
[im 40/40]
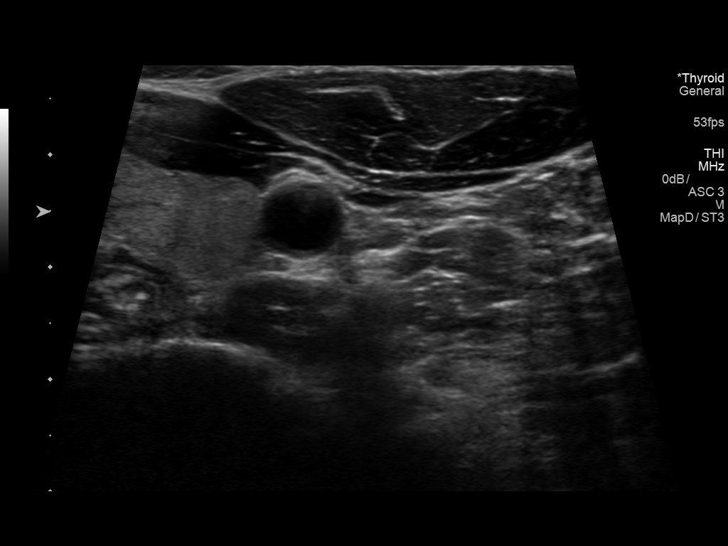

[13 of 25 positions shown; findings below may reference images not displayed]

FINDINGS: Parenchymal Echotexture: Normal

Estimated total number of nodules >/= 1 cm: 0

Number of spongiform nodules >/=  2 cm not described below (TR1): 0

Number of mixed cystic and solid nodules >/= 1.5 cm not described
below (TR2): 0

_________________________________________________________

Isthmus: Normal in size measuring 0.2 cm in diameter

No discrete nodules are identified within the thyroid isthmus.

_________________________________________________________

Right lobe: Normal in size measuring 3.9 x 1.2 x 1.4 cm, unchanged,
previously, 4.5 x 1.3 x 1.3 cm

Note is made of a ill-defined punctate (approximately 0.3 cm)
hypoechoic solid nodule/pseudo nodule within in the mid posterior
aspect the right lobe of the thyroid which is not meet imaging
criteria to recommend percutaneous sampling or dedicated follow-up.

_________________________________________________________

Left lobe: Normal in size measuring 4.4 x 1.5 x 1.1 cm, unchanged,
previously, 4.4 x 1.5 x 1.1 cm.

No discrete nodules are identified within the left lobe of the
thyroid.
IMPRESSION: Unremarkable thyroid ultrasound. No evidence of thyromegaly or
worrisome thyroid nodule/mass.

The above is in keeping with the ACR TI-RADS recommendations - [HOSPITAL] 4989;[DATE].

## 2017-03-24 ENCOUNTER — Other Ambulatory Visit: Payer: Self-pay | Admitting: Internal Medicine

## 2017-03-24 MED FILL — FLUCONAZOLE 150 MG TABLET: 150 | 7 days supply | Qty: 3 | Fill #0

## 2017-03-24 MED FILL — PRENAISSANCE PLUS SOFTGEL: 28-1-250 | 90 days supply | Qty: 90 | Fill #0

## 2017-03-27 MED FILL — VITAMIN D3 5,000 UNIT CAP: 125 MCG | 100 days supply | Qty: 100 | Fill #0

## 2017-04-04 DIAGNOSIS — J3089 Other allergic rhinitis: Secondary | ICD-10-CM | POA: Diagnosis not present

## 2017-04-04 DIAGNOSIS — J301 Allergic rhinitis due to pollen: Secondary | ICD-10-CM | POA: Diagnosis not present

## 2017-04-05 NOTE — Addendum Note (Signed)
Addended by: Felipa Emory on: 04/05/2017 02:54 PM   Modules accepted: Orders

## 2017-04-17 IMAGING — MG 2D DIGITAL SCREENING BILATERAL MAMMOGRAM WITH CAD AND ADJUNCT TO
9 of 13 series · 9 of 29 positions shown · non-contrast
Comparison: Previous exam(s).

CLINICAL DATA: Screening.

EXAM:
2D DIGITAL SCREENING BILATERAL MAMMOGRAM WITH CAD AND ADJUNCT TOMO

[L XCCL]
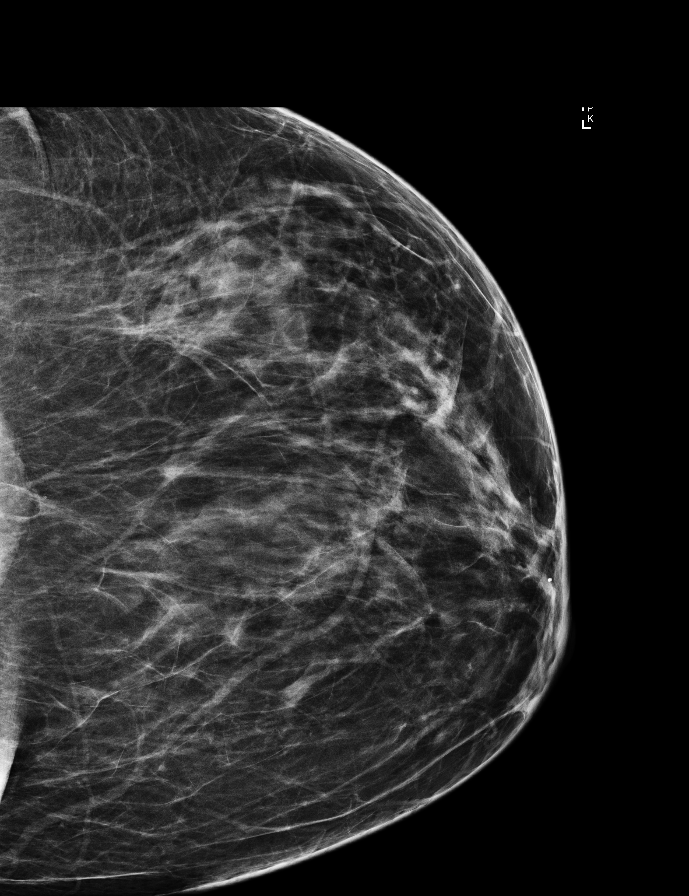

[L MLO synth-2D]
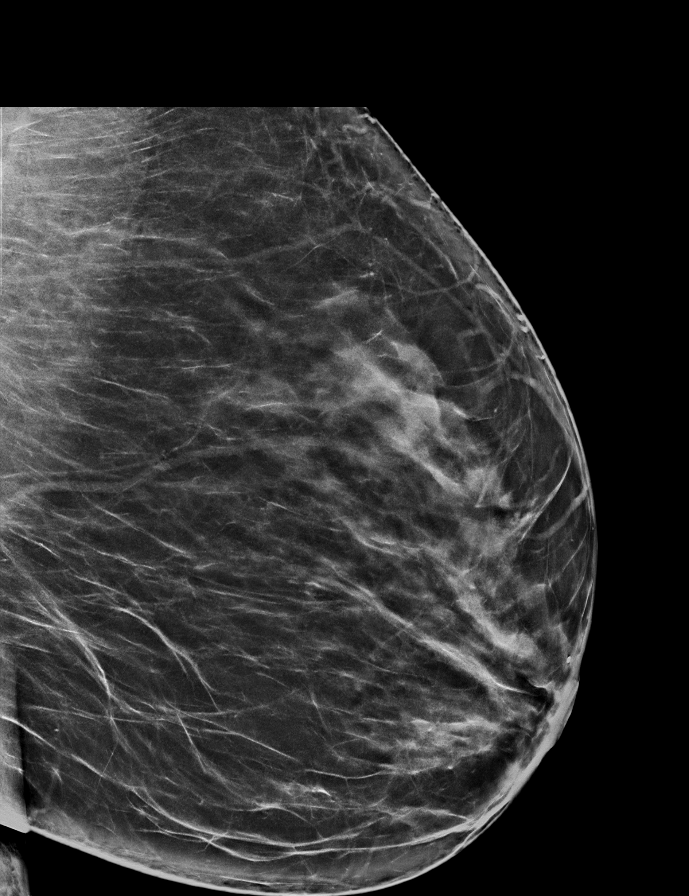

[L CC]
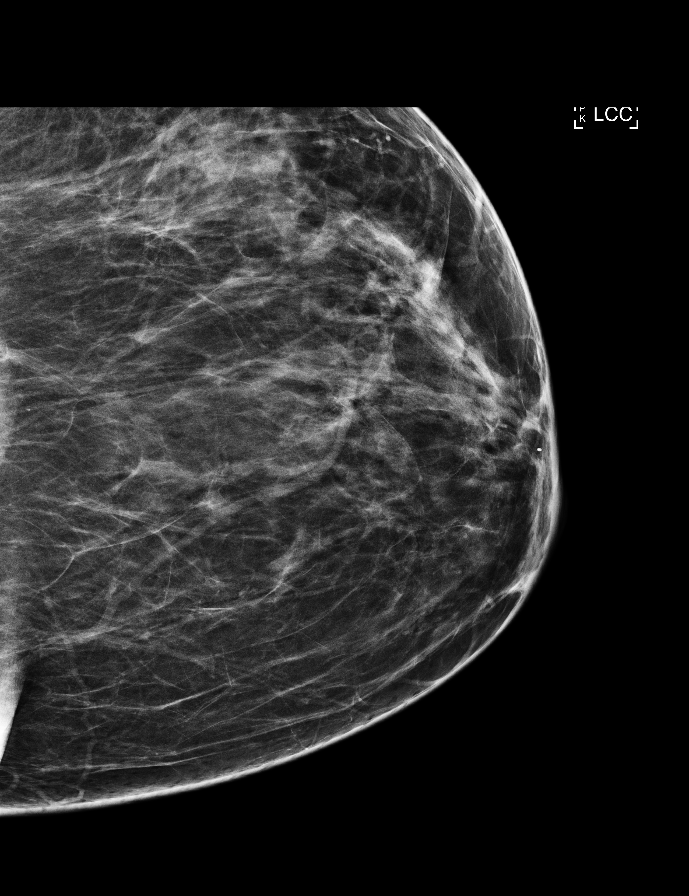

[R MLO synth-2D]
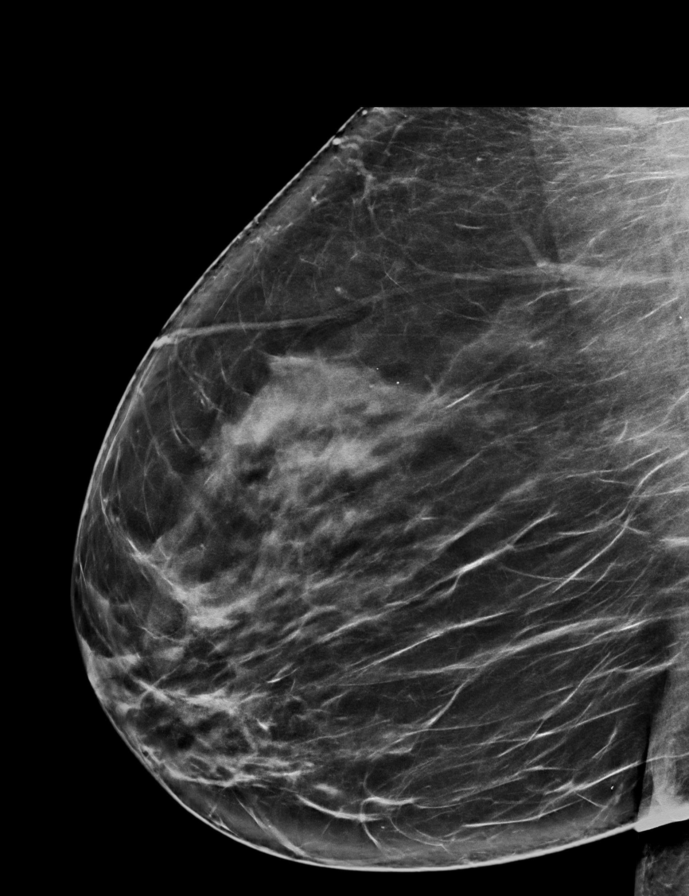

[R CC synth-2D]
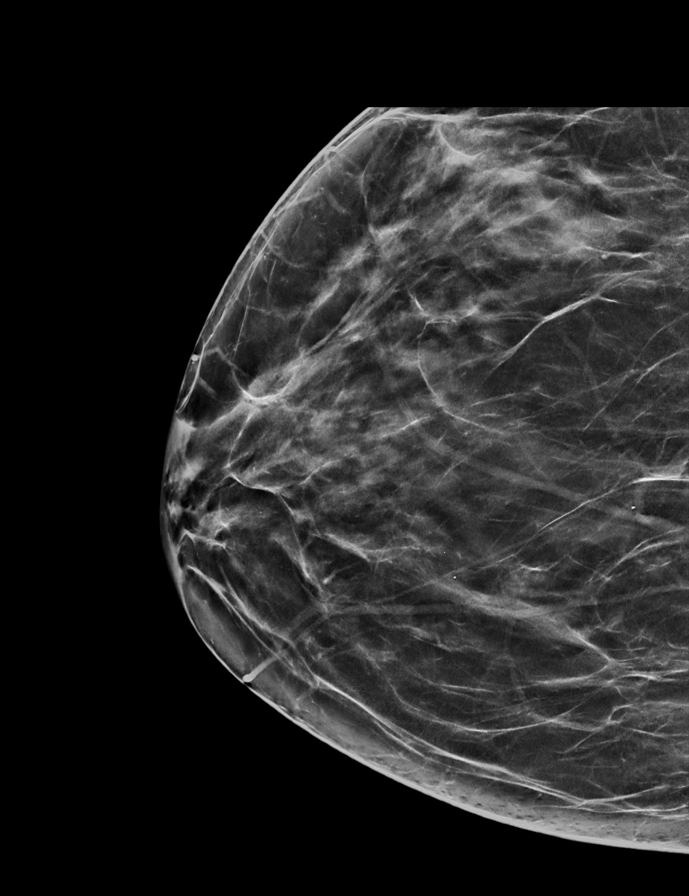

[L CC synth-2D]
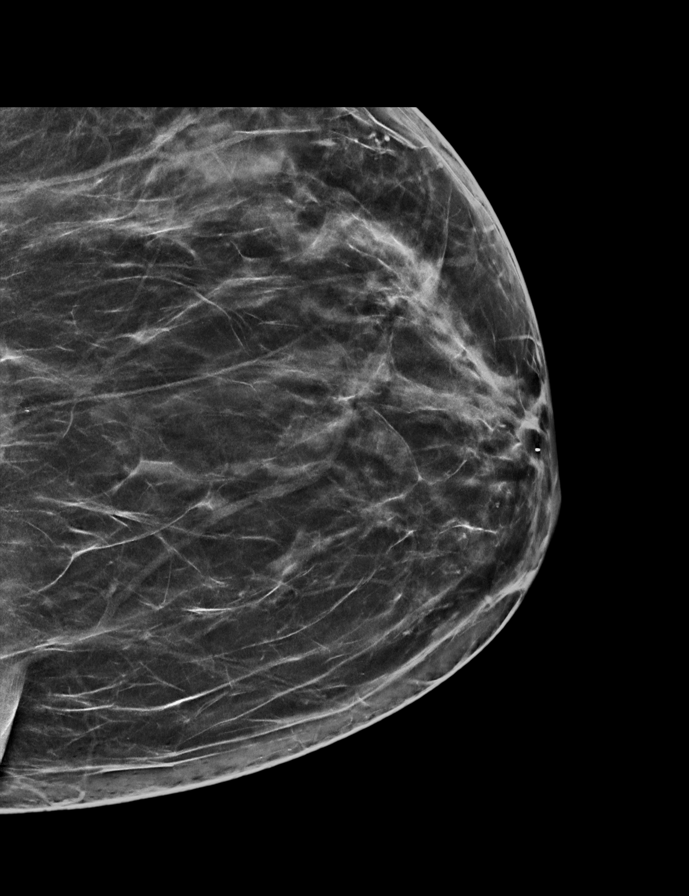

[L MLO]
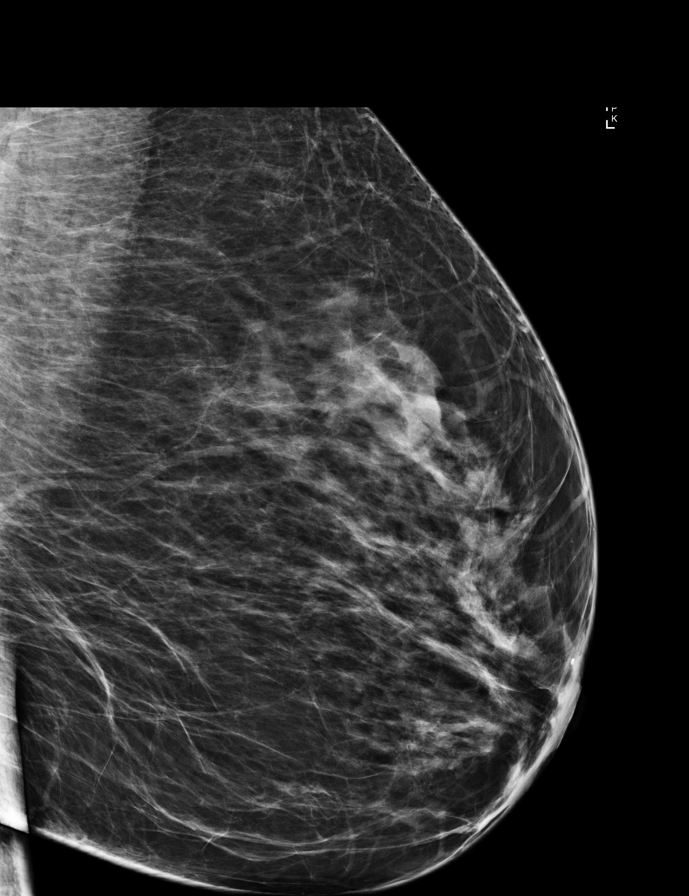

[R CC]
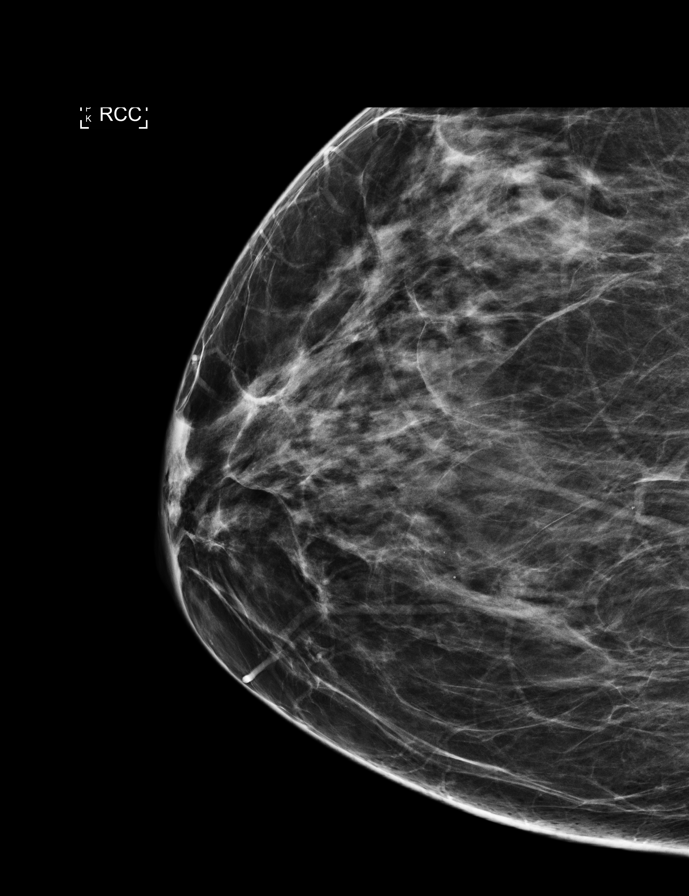

[R MLO]
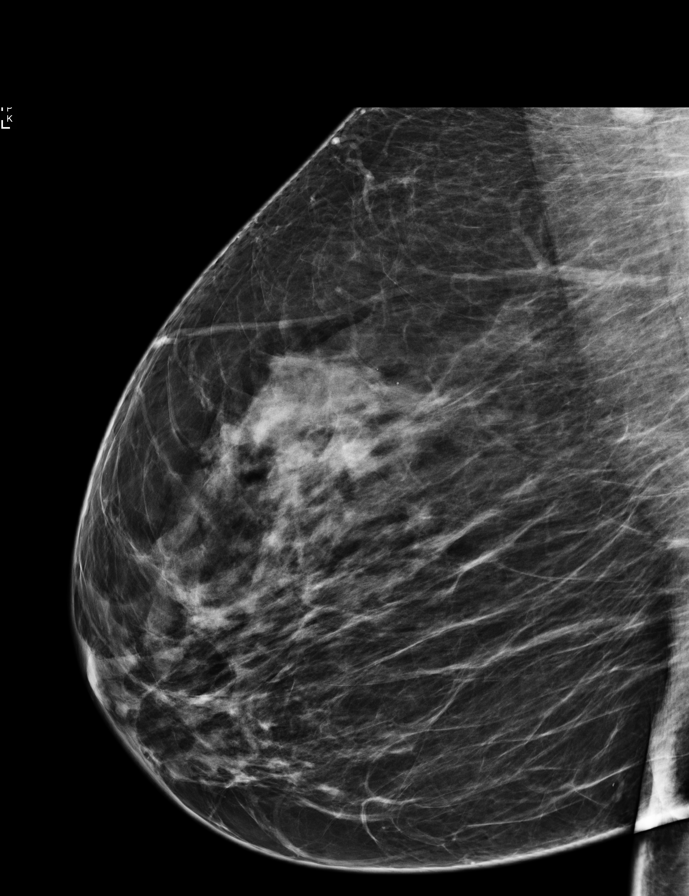

[9 of 29 positions shown; findings below may reference images not displayed]

ACR Breast Density Category c: The breast tissue is heterogeneously
dense, which may obscure small masses.
FINDINGS: There are no findings suspicious for malignancy. Images were
processed with CAD.
IMPRESSION: No mammographic evidence of malignancy. A result letter of this
screening mammogram will be mailed directly to the patient.

RECOMMENDATION:
Screening mammogram in one year. (Code:TN-[id number])

BI-RADS CATEGORY  1: Negative.

## 2017-04-19 DIAGNOSIS — J301 Allergic rhinitis due to pollen: Secondary | ICD-10-CM | POA: Diagnosis not present

## 2017-04-19 DIAGNOSIS — Z6829 Body mass index (BMI) 29.0-29.9, adult: Secondary | ICD-10-CM | POA: Diagnosis not present

## 2017-04-19 DIAGNOSIS — J3089 Other allergic rhinitis: Secondary | ICD-10-CM | POA: Diagnosis not present

## 2017-04-19 DIAGNOSIS — Z1151 Encounter for screening for human papillomavirus (HPV): Secondary | ICD-10-CM | POA: Diagnosis not present

## 2017-04-19 DIAGNOSIS — N911 Secondary amenorrhea: Secondary | ICD-10-CM | POA: Diagnosis not present

## 2017-04-19 DIAGNOSIS — Z01419 Encounter for gynecological examination (general) (routine) without abnormal findings: Secondary | ICD-10-CM | POA: Diagnosis not present

## 2017-04-21 DIAGNOSIS — J301 Allergic rhinitis due to pollen: Secondary | ICD-10-CM | POA: Diagnosis not present

## 2017-04-21 DIAGNOSIS — J3089 Other allergic rhinitis: Secondary | ICD-10-CM | POA: Diagnosis not present

## 2017-05-05 DIAGNOSIS — J301 Allergic rhinitis due to pollen: Secondary | ICD-10-CM | POA: Diagnosis not present

## 2017-05-05 DIAGNOSIS — J3081 Allergic rhinitis due to animal (cat) (dog) hair and dander: Secondary | ICD-10-CM | POA: Diagnosis not present

## 2017-05-10 DIAGNOSIS — J3089 Other allergic rhinitis: Secondary | ICD-10-CM | POA: Diagnosis not present

## 2017-05-10 DIAGNOSIS — J301 Allergic rhinitis due to pollen: Secondary | ICD-10-CM | POA: Diagnosis not present

## 2017-05-15 DIAGNOSIS — J3089 Other allergic rhinitis: Secondary | ICD-10-CM | POA: Diagnosis not present

## 2017-05-15 DIAGNOSIS — J301 Allergic rhinitis due to pollen: Secondary | ICD-10-CM | POA: Diagnosis not present

## 2017-05-15 DIAGNOSIS — H1045 Other chronic allergic conjunctivitis: Secondary | ICD-10-CM | POA: Diagnosis not present

## 2017-06-19 DIAGNOSIS — J301 Allergic rhinitis due to pollen: Secondary | ICD-10-CM | POA: Diagnosis not present

## 2017-06-19 DIAGNOSIS — J3089 Other allergic rhinitis: Secondary | ICD-10-CM | POA: Diagnosis not present

## 2017-07-17 ENCOUNTER — Other Ambulatory Visit: Payer: Self-pay | Admitting: Internal Medicine

## 2017-07-17 DIAGNOSIS — Z1231 Encounter for screening mammogram for malignant neoplasm of breast: Secondary | ICD-10-CM

## 2017-07-28 DIAGNOSIS — J3089 Other allergic rhinitis: Secondary | ICD-10-CM | POA: Diagnosis not present

## 2017-07-28 DIAGNOSIS — J301 Allergic rhinitis due to pollen: Secondary | ICD-10-CM | POA: Diagnosis not present

## 2017-07-31 DIAGNOSIS — J3089 Other allergic rhinitis: Secondary | ICD-10-CM | POA: Diagnosis not present

## 2017-07-31 DIAGNOSIS — J301 Allergic rhinitis due to pollen: Secondary | ICD-10-CM | POA: Diagnosis not present

## 2017-08-03 ENCOUNTER — Inpatient Hospital Stay: Admission: RE | Admit: 2017-08-03 | Payer: 59 | Source: Ambulatory Visit

## 2017-08-07 DIAGNOSIS — J301 Allergic rhinitis due to pollen: Secondary | ICD-10-CM | POA: Diagnosis not present

## 2017-08-07 DIAGNOSIS — J3089 Other allergic rhinitis: Secondary | ICD-10-CM | POA: Diagnosis not present

## 2017-08-09 DIAGNOSIS — J301 Allergic rhinitis due to pollen: Secondary | ICD-10-CM | POA: Diagnosis not present

## 2017-08-09 DIAGNOSIS — J3089 Other allergic rhinitis: Secondary | ICD-10-CM | POA: Diagnosis not present

## 2017-08-10 ENCOUNTER — Ambulatory Visit
Admission: RE | Admit: 2017-08-10 | Discharge: 2017-08-10 | Disposition: A | Discharge status: Home / Self Care | Payer: 59 | Source: Ambulatory Visit | Attending: Internal Medicine | Admitting: Internal Medicine

## 2017-08-10 DIAGNOSIS — Z1231 Encounter for screening mammogram for malignant neoplasm of breast: Secondary | ICD-10-CM

## 2017-08-14 DIAGNOSIS — J301 Allergic rhinitis due to pollen: Secondary | ICD-10-CM | POA: Diagnosis not present

## 2017-08-14 DIAGNOSIS — J3089 Other allergic rhinitis: Secondary | ICD-10-CM | POA: Diagnosis not present

## 2017-09-13 MED FILL — VITAMIN D3 5,000 UNIT CAP: 125 MCG | 80 days supply | Qty: 80 | Fill #1

## 2017-09-15 MED FILL — PRENAISSANCE PLUS SOFTGEL: 28-1-250 | 90 days supply | Qty: 90 | Fill #0

## 2017-10-18 MED FILL — FLUCONAZOLE 150 MG TABS: 150 | 7 days supply | Qty: 3 | Fill #0

## 2017-11-27 ENCOUNTER — Encounter: Payer: Self-pay | Admitting: Internal Medicine

## 2017-11-27 ENCOUNTER — Ambulatory Visit (INDEPENDENT_AMBULATORY_CARE_PROVIDER_SITE_OTHER): Payer: 59 | Admitting: Internal Medicine

## 2017-11-27 ENCOUNTER — Telehealth: Payer: Self-pay

## 2017-11-27 ENCOUNTER — Other Ambulatory Visit (INDEPENDENT_AMBULATORY_CARE_PROVIDER_SITE_OTHER): Payer: 59

## 2017-11-27 VITALS — BP 118/86 | HR 73 | Temp 98.4°F | Ht 62.5 in | Wt 164.0 lb

## 2017-11-27 DIAGNOSIS — Z209 Contact with and (suspected) exposure to unspecified communicable disease: Secondary | ICD-10-CM

## 2017-11-27 DIAGNOSIS — J3089 Other allergic rhinitis: Secondary | ICD-10-CM | POA: Diagnosis not present

## 2017-11-27 DIAGNOSIS — J302 Other seasonal allergic rhinitis: Secondary | ICD-10-CM

## 2017-11-27 DIAGNOSIS — Z0000 Encounter for general adult medical examination without abnormal findings: Secondary | ICD-10-CM

## 2017-11-27 LAB — COMPREHENSIVE METABOLIC PANEL
ALK PHOS: 57 U/L (ref 39–117)
ALT: 16 U/L (ref 0–35)
AST: 17 U/L (ref 0–37)
Albumin: 4.5 g/dL (ref 3.5–5.2)
BUN: 14 mg/dL (ref 6–23)
CO2: 28 mEq/L (ref 19–32)
Calcium: 9.9 mg/dL (ref 8.4–10.5)
Chloride: 103 mEq/L (ref 96–112)
Creatinine, Ser: 1.05 mg/dL (ref 0.40–1.20)
GFR: 72.81 mL/min (ref >60.00)
GLUCOSE: 102 mg/dL — AB (ref 70–99)
POTASSIUM: 4.2 meq/L (ref 3.5–5.1)
SODIUM: 141 meq/L (ref 135–145)
TOTAL PROTEIN: 7.8 g/dL (ref 6.0–8.3)
Total Bilirubin: 0.4 mg/dL (ref 0.2–1.2)

## 2017-11-27 LAB — LIPID PANEL
Cholesterol: 222 mg/dL — ABNORMAL HIGH (ref 0–200)
HDL: 48.5 mg/dL (ref >39.00)
LDL Cholesterol: 144 mg/dL — ABNORMAL HIGH (ref 0–99)
NONHDL: 173.31
Total CHOL/HDL Ratio: 5
Triglycerides: 145 mg/dL (ref 0.0–149.0)
VLDL: 29 mg/dL (ref 0.0–40.0)

## 2017-11-27 LAB — HEMOGLOBIN A1C: HEMOGLOBIN A1C: 5.9 % (ref 4.6–6.5)

## 2017-11-27 LAB — CBC
HCT: 39.7 % (ref 36.0–46.0)
HEMOGLOBIN: 13.3 g/dL (ref 12.0–15.0)
MCHC: 33.6 g/dL (ref 30.0–36.0)
MCV: 80.1 fl (ref 78.0–100.0)
Platelets: 313 10*3/uL (ref 150.0–400.0)
RBC: 4.96 Mil/uL (ref 3.87–5.11)
RDW: 13.2 % (ref 11.5–15.5)
WBC: 5.1 10*3/uL (ref 4.0–10.5)

## 2017-11-27 NOTE — Assessment & Plan Note (Signed)
Tdap up to date. Flu shot yearly. Pap smear with gyn. Mammogram with gyn. No family history to suggest need for early colon screening. Counseled about sun safety and dangers of distracted driving. Given screening recommendations.

## 2017-11-27 NOTE — Telephone Encounter (Signed)
Copied from [place] [phone]. Topic: General - Other >> Nov 27, 2017  2:47 PM Yvette Rack wrote: Reason for CRM: pt calling wanting to know if she can get a MMR titer for school please give her a call back at [phone]

## 2017-11-27 NOTE — Telephone Encounter (Signed)
Patient informed of Md response and stated understanding  

## 2017-11-27 NOTE — Progress Notes (Signed)
   Subjective:    Patient ID: Shirley Wallace, female    DOB: [birth year], 47 y.o.   MRN: [id number]  HPI The patient is a 47 YO female coming in for physical. No new concerns. 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   PMH, [facility], social history reviewed and updated.   Review of Systems  Constitutional: Negative.   HENT: Negative.   Eyes: Negative.   Respiratory: Negative for cough, chest tightness and shortness of breath.   Cardiovascular: Negative for chest pain, palpitations and leg swelling.  Gastrointestinal: Negative for abdominal distention, abdominal pain, constipation, diarrhea, nausea and vomiting.  Musculoskeletal: Negative.   Skin: Negative.   Neurological: Negative.   Psychiatric/Behavioral: Negative.       Objective:   Physical Exam  Constitutional: She is oriented to person, place, and time. She appears well-developed and well-nourished.  HENT:  Head: Normocephalic and atraumatic.  Eyes: EOM are normal.  Neck: Normal range of motion.  Cardiovascular: Normal rate and regular rhythm.  Pulmonary/Chest: Effort normal and breath sounds normal. No respiratory distress. She has no wheezes. She has no rales.  Abdominal: Soft. Bowel sounds are normal. She exhibits no distension. There is no tenderness. There is no rebound.  Musculoskeletal: She exhibits no edema.  Neurological: She is alert and oriented to person, place, and time. Coordination normal.  Skin: Skin is warm and dry.  Psychiatric: She has a normal mood and affect.   Vitals:   11/27/17 1037  BP: 118/86  Pulse: 73  Temp: 98.4 F (36.9 C)  TempSrc: Oral  SpO2: 96%  Weight: 164 lb (74.4 kg)  Height: 5' 2.5" (1.588 m)      Assessment & Plan:

## 2017-11-27 NOTE — Assessment & Plan Note (Signed)
Uses as needed and sees allergy specialist.

## 2017-11-27 NOTE — Telephone Encounter (Signed)
Order placed to get drawn. Insurance may not cover however.

## 2017-11-27 NOTE — Patient Instructions (Addendum)
Call the attention specialists to get tested and on treatment for attention at: [address], [place], [place] [address] Phone: [phone]  Health Maintenance, Female Adopting a healthy lifestyle and getting preventive care can go a long way to promote health and wellness. Talk with your health care provider about what schedule of regular examinations is right for you. This is a good chance for you to check in with your provider about disease prevention and staying healthy. In between checkups, there are plenty of things you can do on your own. Experts have done a lot of research about which lifestyle changes and preventive measures are most likely to keep you healthy. Ask your health care provider for more information. Weight and diet Eat a healthy diet  Be sure to include plenty of vegetables, fruits, low-fat dairy products, and lean protein.  Do not eat a lot of foods high in solid fats, added sugars, or salt.  Get regular exercise. This is one of the most important things you can do for your health. ? Most adults should exercise for at least 150 minutes each week. The exercise should increase your heart rate and make you sweat (moderate-intensity exercise). ? Most adults should also do strengthening exercises at least twice a week. This is in addition to the moderate-intensity exercise.  Maintain a healthy weight  Body mass index (BMI) is a measurement that can be used to identify possible weight problems. It estimates body fat based on height and weight. Your health care provider can help determine your BMI and help you achieve or maintain a healthy weight.  For females 24 years of age and older: ? A BMI below 18.5 is considered underweight. ? A BMI of 18.5 to 24.9 is normal. ? A BMI of 25 to 29.9 is considered overweight. ? A BMI of 30 and above is considered obese.  Watch levels of cholesterol and blood lipids  You should start having your blood tested for lipids and cholesterol at  45 years of age, then have this test every 5 years.  You may need to have your cholesterol levels checked more often if: ? Your lipid or cholesterol levels are high. ? You are older than 45 years of age. ? You are at high risk for heart disease.  Cancer screening Lung Cancer  Lung cancer screening is recommended for adults 71-21 years old who are at high risk for lung cancer because of a history of smoking.  A yearly low-dose CT scan of the lungs is recommended for people who: ? Currently smoke. ? Have quit within the past 15 years. ? Have at least a 30-pack-year history of smoking. A pack year is smoking an average of one pack of cigarettes a day for 1 year.  Yearly screening should continue until it has been 15 years since you quit.  Yearly screening should stop if you develop a health problem that would prevent you from having lung cancer treatment.  Breast Cancer  Practice breast self-awareness. This means understanding how your breasts normally appear and feel.  It also means doing regular breast self-exams. Let your health care provider know about any changes, no matter how small.  If you are in your 20s or 30s, you should have a clinical breast exam (CBE) by a health care provider every 1-3 years as part of a regular health exam.  If you are 32 or older, have a CBE every year. Also consider having a breast X-ray (mammogram) every year.  If  you have a family history of breast cancer, talk to your health care provider about genetic screening.  If you are at high risk for breast cancer, talk to your health care provider about having an MRI and a mammogram every year.  Breast cancer gene (BRCA) assessment is recommended for women who have family members with BRCA-related cancers. BRCA-related cancers include: ? Breast. ? Ovarian. ? Tubal. ? Peritoneal cancers.  Results of the assessment will determine the need for genetic counseling and BRCA1 and BRCA2 testing.  Cervical  Cancer Your health care provider may recommend that you be screened regularly for cancer of the pelvic organs (ovaries, uterus, and vagina). This screening involves a pelvic examination, including checking for microscopic changes to the surface of your cervix (Pap test). You may be encouraged to have this screening done every 3 years, beginning at age 68.  For women ages 86-65, health care providers may recommend pelvic exams and Pap testing every 3 years, or they may recommend the Pap and pelvic exam, combined with testing for human papilloma virus (HPV), every 5 years. Some types of HPV increase your risk of cervical cancer. Testing for HPV may also be done on women of any age with unclear Pap test results.  Other health care providers may not recommend any screening for nonpregnant women who are considered low risk for pelvic cancer and who do not have symptoms. Ask your health care provider if a screening pelvic exam is right for you.  If you have had past treatment for cervical cancer or a condition that could lead to cancer, you need Pap tests and screening for cancer for at least 20 years after your treatment. If Pap tests have been discontinued, your risk factors (such as having a new sexual partner) need to be reassessed to determine if screening should resume. Some women have medical problems that increase the chance of getting cervical cancer. In these cases, your health care provider may recommend more frequent screening and Pap tests.  Colorectal Cancer  This type of cancer can be detected and often prevented.  Routine colorectal cancer screening usually begins at 45 years of age and continues through 45 years of age.  Your health care provider may recommend screening at an earlier age if you have risk factors for colon cancer.  Your health care provider may also recommend using home test kits to check for hidden blood in the stool.  A small camera at the end of a tube can be used to  examine your colon directly (sigmoidoscopy or colonoscopy). This is done to check for the earliest forms of colorectal cancer.  Routine screening usually begins at age 17.  Direct examination of the colon should be repeated every 5-10 years through 45 years of age. However, you may need to be screened more often if early forms of precancerous polyps or small growths are found.  Skin Cancer  Check your skin from head to toe regularly.  Tell your health care provider about any new moles or changes in moles, especially if there is a change in a mole's shape or color.  Also tell your health care provider if you have a mole that is larger than the size of a pencil eraser.  Always use sunscreen. Apply sunscreen liberally and repeatedly throughout the day.  Protect yourself by wearing long sleeves, pants, a wide-brimmed hat, and sunglasses whenever you are outside.  Heart disease, diabetes, and high blood pressure  High blood pressure causes heart disease and increases  the risk of stroke. High blood pressure is more likely to develop in: ? People who have blood pressure in the high end of the normal range (130-139/85-89 mm Hg). ? People who are overweight or obese. ? People who are African American.  If you are 41-17 years of age, have your blood pressure checked every 3-5 years. If you are 40 years of age or older, have your blood pressure checked every year. You should have your blood pressure measured twice-once when you are at a hospital or clinic, and once when you are not at a hospital or clinic. Record the average of the two measurements. To check your blood pressure when you are not at a hospital or clinic, you can use: ? An automated blood pressure machine at a pharmacy. ? A home blood pressure monitor.  If you are between 10 years and 68 years old, ask your health care provider if you should take aspirin to prevent strokes.  Have regular diabetes screenings. This involves taking a  blood sample to check your fasting blood sugar level. ? If you are at a normal weight and have a low risk for diabetes, have this test once every three years after 45 years of age. ? If you are overweight and have a high risk for diabetes, consider being tested at a younger age or more often. Preventing infection Hepatitis B  If you have a higher risk for hepatitis B, you should be screened for this virus. You are considered at high risk for hepatitis B if: ? You were born in a country where hepatitis B is common. Ask your health care provider which countries are considered high risk. ? Your parents were born in a high-risk country, and you have not been immunized against hepatitis B (hepatitis B vaccine). ? You have HIV or AIDS. ? You use needles to inject street drugs. ? You live with someone who has hepatitis B. ? You have had sex with someone who has hepatitis B. ? You get hemodialysis treatment. ? You take certain medicines for conditions, including cancer, organ transplantation, and autoimmune conditions.  Hepatitis C  Blood testing is recommended for: ? Everyone born from [birth year] through 1963. ? Anyone with known risk factors for hepatitis C.  Sexually transmitted infections (STIs)  You should be screened for sexually transmitted infections (STIs) including gonorrhea and chlamydia if: ? You are sexually active and are younger than 45 years of age. ? You are older than 45 years of age and your health care provider tells you that you are at risk for this type of infection. ? Your sexual activity has changed since you were last screened and you are at an increased risk for chlamydia or gonorrhea. Ask your health care provider if you are at risk.  If you do not have HIV, but are at risk, it may be recommended that you take a prescription medicine daily to prevent HIV infection. This is called pre-exposure prophylaxis (PrEP). You are considered at risk if: ? You are sexually active and  do not regularly use condoms or know the HIV status of your partner(s). ? You take drugs by injection. ? You are sexually active with a partner who has HIV.  Talk with your health care provider about whether you are at high risk of being infected with HIV. If you choose to begin PrEP, you should first be tested for HIV. You should then be tested every 3 months for as long as you are taking PrEP.  Pregnancy  If you are premenopausal and you may become pregnant, ask your health care provider about preconception counseling.  If you may become pregnant, take 400 to 800 micrograms (mcg) of folic acid every day.  If you want to prevent pregnancy, talk to your health care provider about birth control (contraception). Osteoporosis and menopause  Osteoporosis is a disease in which the bones lose minerals and strength with aging. This can result in serious bone fractures. Your risk for osteoporosis can be identified using a bone density scan.  If you are 37 years of age or older, or if you are at risk for osteoporosis and fractures, ask your health care provider if you should be screened.  Ask your health care provider whether you should take a calcium or vitamin D supplement to lower your risk for osteoporosis.  Menopause may have certain physical symptoms and risks.  Hormone replacement therapy may reduce some of these symptoms and risks. Talk to your health care provider about whether hormone replacement therapy is right for you. Follow these instructions at home:  Schedule regular health, dental, and eye exams.  Stay current with your immunizations.  Do not use any tobacco products including cigarettes, chewing tobacco, or electronic cigarettes.  If you are pregnant, do not drink alcohol.  If you are breastfeeding, limit how much and how often you drink alcohol.  Limit alcohol intake to no more than 1 drink per day for nonpregnant women. One drink equals 12 ounces of beer, 5 ounces of  wine, or 1 ounces of hard liquor.  Do not use street drugs.  Do not share needles.  Ask your health care provider for help if you need support or information about quitting drugs.  Tell your health care provider if you often feel depressed.  Tell your health care provider if you have ever been abused or do not feel safe at home. This information is not intended to replace advice given to you by your health care provider. Make sure you discuss any questions you have with your health care provider. Document Released: 11/01/2010 Document Revised: 09/24/2015 Document Reviewed: 01/20/2015 Elsevier Interactive Patient Education  [employer].

## 2017-11-28 ENCOUNTER — Other Ambulatory Visit: Payer: 59

## 2017-11-28 DIAGNOSIS — Z209 Contact with and (suspected) exposure to unspecified communicable disease: Secondary | ICD-10-CM | POA: Diagnosis not present

## 2017-11-29 DIAGNOSIS — J3089 Other allergic rhinitis: Secondary | ICD-10-CM | POA: Diagnosis not present

## 2017-11-29 DIAGNOSIS — J301 Allergic rhinitis due to pollen: Secondary | ICD-10-CM | POA: Diagnosis not present

## 2017-11-29 DIAGNOSIS — H1045 Other chronic allergic conjunctivitis: Secondary | ICD-10-CM | POA: Diagnosis not present

## 2017-11-29 LAB — MEASLES/MUMPS/RUBELLA IMMUNITY
Mumps IgG: 73.9 AU/mL
Rubella: 23.8 index
Rubeola IgG: >300 AU/mL

## 2017-11-30 ENCOUNTER — Telehealth: Payer: Self-pay | Admitting: Internal Medicine

## 2017-11-30 NOTE — Telephone Encounter (Signed)
Patient informed of results and results faxed to fax number below

## 2017-11-30 NOTE — Telephone Encounter (Signed)
Copied from [place] [phone]. Topic: Quick Communication - See Telephone Encounter >> Nov 30, 2017 12:15 PM Bea Graff, NT wrote: CRM for notification. See Telephone encounter for: 11/30/17. Pt would like a call with her MMR result. And she would like to result faxed to [phone]

## 2017-12-04 DIAGNOSIS — J3089 Other allergic rhinitis: Secondary | ICD-10-CM | POA: Diagnosis not present

## 2017-12-04 DIAGNOSIS — J301 Allergic rhinitis due to pollen: Secondary | ICD-10-CM | POA: Diagnosis not present

## 2018-03-21 ENCOUNTER — Encounter: Payer: Self-pay | Admitting: Internal Medicine

## 2018-03-21 ENCOUNTER — Ambulatory Visit: Payer: 59 | Admitting: Internal Medicine

## 2018-03-21 DIAGNOSIS — R21 Rash and other nonspecific skin eruption: Secondary | ICD-10-CM | POA: Insufficient documentation

## 2018-03-21 MED ORDER — NYSTATIN-TRIAMCINOLONE 100000-0.1 UNIT/GM-% EX CREA: 1.0000 "application " | TOPICAL_CREAM | Freq: Two times a day (BID) | CUTANEOUS | 0 refills | Status: DC

## 2018-03-21 MED ORDER — METHYLPREDNISOLONE ACETATE 80 MG/ML IJ SUSP
80.0000 mg | Freq: Once | INTRAMUSCULAR | Status: AC
  Administered 2018-03-21: 80 mg via INTRAMUSCULAR

## 2018-03-21 MED FILL — NYSTATIN-TRIAMCINOLONE CRM: 100000-0.1 | 30 days supply | Qty: 90 | Fill #0

## 2018-03-21 NOTE — Assessment & Plan Note (Signed)
Depo-medrol 80 mg IM given at visit. Rx for nystatin/triamcinolone cream at visit.

## 2018-03-21 NOTE — Patient Instructions (Addendum)
We have sent in the cream to use anywhere it itches twice a day.  We have given you a shot today to help with the rash and itching.

## 2018-03-21 NOTE — Progress Notes (Signed)
   Subjective:    Patient ID: Shirley Wallace, female    DOB: December 14, 1970, 47 y.o.   MRN: [id number]  HPI The patient is a 47 YO female coming in for rash and itching. A relative is visiting from [place] and had this rash and now all the family has it. She has tried creams over the counter for it without relief. Started about 2 weeks ago. Overall worsening. She has tried benadryl which helps some to get her to sleep but the itching came right back. Denies pain. They do switch household products often but she cannot remember any big changes recently.   Review of Systems  Constitutional: Negative.   HENT: Negative.   Eyes: Negative.   Respiratory: Negative for cough, chest tightness and shortness of breath.   Cardiovascular: Negative for chest pain, palpitations and leg swelling.  Gastrointestinal: Negative for abdominal distention, abdominal pain, constipation, diarrhea, nausea and vomiting.  Musculoskeletal: Negative.   Skin: Positive for rash.  Neurological: Negative.   Psychiatric/Behavioral: Negative.       Objective:   Physical Exam  Constitutional: She is oriented to person, place, and time. She appears well-developed and well-nourished.  HENT:  Head: Normocephalic and atraumatic.  Eyes: EOM are normal.  Neck: Normal range of motion.  Cardiovascular: Normal rate and regular rhythm.  Pulmonary/Chest: Effort normal and breath sounds normal. No respiratory distress. She has no wheezes. She has no rales.  Musculoskeletal: She exhibits no edema.  Neurological: She is alert and oriented to person, place, and time. Coordination normal.  Skin: Skin is warm and dry. Rash noted.  Red lesions on the arms, stomach, thighs, back. Some stigmata of scratching.    Vitals:   03/21/18 1108  BP: 130/88  Pulse: 67  Temp: 98.6 F (37 C)  TempSrc: Oral  SpO2: 99%  Weight: 149 lb (67.6 kg)  Height: 5' 2.5" (1.588 m)      Assessment & Plan:  Depo-medrol 80 mg IM given at visit

## 2018-03-21 NOTE — Addendum Note (Signed)
Addended by: Raford Pitcher R on: 03/21/2018 01:09 PM   Modules accepted: Orders

## 2018-04-23 DIAGNOSIS — Z1159 Encounter for screening for other viral diseases: Secondary | ICD-10-CM | POA: Diagnosis not present

## 2018-04-23 DIAGNOSIS — Z113 Encounter for screening for infections with a predominantly sexual mode of transmission: Secondary | ICD-10-CM | POA: Diagnosis not present

## 2018-04-23 DIAGNOSIS — Z01419 Encounter for gynecological examination (general) (routine) without abnormal findings: Secondary | ICD-10-CM | POA: Diagnosis not present

## 2018-04-23 DIAGNOSIS — Z118 Encounter for screening for other infectious and parasitic diseases: Secondary | ICD-10-CM | POA: Diagnosis not present

## 2018-04-23 DIAGNOSIS — Z6826 Body mass index (BMI) 26.0-26.9, adult: Secondary | ICD-10-CM | POA: Diagnosis not present

## 2018-04-23 DIAGNOSIS — N952 Postmenopausal atrophic vaginitis: Secondary | ICD-10-CM | POA: Diagnosis not present

## 2018-04-23 DIAGNOSIS — Z1151 Encounter for screening for human papillomavirus (HPV): Secondary | ICD-10-CM | POA: Diagnosis not present

## 2018-04-23 DIAGNOSIS — Z114 Encounter for screening for human immunodeficiency virus [HIV]: Secondary | ICD-10-CM | POA: Diagnosis not present

## 2018-04-23 MED FILL — PREMARIN VAGINAL CREAM-APPL: 0.625 | 90 days supply | Qty: 30 | Fill #0

## 2018-04-26 LAB — HM PAP SMEAR: HM Pap smear: NEGATIVE

## 2018-04-27 LAB — RPR: RPR: NONREACTIVE

## 2018-05-09 DIAGNOSIS — H524 Presbyopia: Secondary | ICD-10-CM | POA: Diagnosis not present

## 2018-08-08 ENCOUNTER — Other Ambulatory Visit: Payer: Self-pay | Admitting: Internal Medicine

## 2018-08-08 DIAGNOSIS — Z1231 Encounter for screening mammogram for malignant neoplasm of breast: Secondary | ICD-10-CM

## 2018-08-17 ENCOUNTER — Telehealth: Payer: Self-pay | Admitting: Internal Medicine

## 2018-08-17 NOTE — Telephone Encounter (Signed)
Copied from [place] [phone]. Topic: Appointment Scheduling - Scheduling Inquiry for Clinic >> Aug 17, 2018  4:52 PM Blase Mess A wrote:  Reason for CRM: Patient is calling to schedule CPE in July [phone]

## 2018-08-17 NOTE — Telephone Encounter (Signed)
Copied from [place] [phone]. Topic: Quick Communication - Rx Refill/Question >> Aug 17, 2018  4:54 PM Blase Mess A wrote: Medication: Cholecalciferol (VITAMIN D3) 5000 units CAPS [[id number]]   Has the patient contacted their pharmacy? Yes  (Agent: If no, request that the patient contact the pharmacy for the refill.) (Agent: If yes, when and what did the pharmacy advise?)  Preferred Pharmacy (with phone number or street name): [place], [place] - [address] [phone] (Phone) [phone] (Fax)    Agent: Please be advised that RX refills may take up to 3 business days. We ask that you follow-up with your pharmacy.

## 2018-08-20 MED ORDER — VITAMIN D3 125 MCG (5000 UT) PO CAPS: 1.0000 | ORAL_CAPSULE | Freq: Every day | ORAL | 1 refills | Status: DC

## 2018-08-20 NOTE — Telephone Encounter (Signed)
Scheduled

## 2018-08-20 NOTE — Telephone Encounter (Signed)
Medication sent.

## 2018-08-20 NOTE — Telephone Encounter (Signed)
Is this okay to refill? 

## 2018-08-20 NOTE — Telephone Encounter (Signed)
Fine to refill 

## 2018-10-11 ENCOUNTER — Ambulatory Visit
Admission: RE | Admit: 2018-10-11 | Discharge: 2018-10-11 | Disposition: A | Discharge status: Home / Self Care | Payer: 59 | Source: Ambulatory Visit | Attending: Internal Medicine | Admitting: Internal Medicine

## 2018-10-11 ENCOUNTER — Other Ambulatory Visit: Payer: Self-pay

## 2018-10-11 DIAGNOSIS — Z1231 Encounter for screening mammogram for malignant neoplasm of breast: Secondary | ICD-10-CM

## 2018-12-03 ENCOUNTER — Other Ambulatory Visit: Payer: Self-pay

## 2018-12-03 ENCOUNTER — Encounter: Payer: Self-pay | Admitting: Internal Medicine

## 2018-12-03 ENCOUNTER — Other Ambulatory Visit (INDEPENDENT_AMBULATORY_CARE_PROVIDER_SITE_OTHER): Payer: 59

## 2018-12-03 ENCOUNTER — Ambulatory Visit (INDEPENDENT_AMBULATORY_CARE_PROVIDER_SITE_OTHER): Payer: 59 | Admitting: Internal Medicine

## 2018-12-03 VITALS — BP 110/80 | HR 72 | Temp 98.5°F | Ht 62.5 in | Wt 153.0 lb

## 2018-12-03 DIAGNOSIS — J302 Other seasonal allergic rhinitis: Secondary | ICD-10-CM | POA: Diagnosis not present

## 2018-12-03 DIAGNOSIS — Z0000 Encounter for general adult medical examination without abnormal findings: Secondary | ICD-10-CM | POA: Diagnosis not present

## 2018-12-03 DIAGNOSIS — J3089 Other allergic rhinitis: Secondary | ICD-10-CM | POA: Diagnosis not present

## 2018-12-03 DIAGNOSIS — N951 Menopausal and female climacteric states: Secondary | ICD-10-CM | POA: Diagnosis not present

## 2018-12-03 LAB — VITAMIN D 25 HYDROXY (VIT D DEFICIENCY, FRACTURES): VITD: 31.48 ng/mL (ref 30.00–100.00)

## 2018-12-03 LAB — COMPREHENSIVE METABOLIC PANEL
ALT: 11 U/L (ref 0–35)
AST: 16 U/L (ref 0–37)
Albumin: 4.5 g/dL (ref 3.5–5.2)
Alkaline Phosphatase: 60 U/L (ref 39–117)
BUN: 14 mg/dL (ref 6–23)
CO2: 27 mEq/L (ref 19–32)
Calcium: 9.5 mg/dL (ref 8.4–10.5)
Chloride: 105 mEq/L (ref 96–112)
Creatinine, Ser: 0.9 mg/dL (ref 0.40–1.20)
GFR: 81.47 mL/min (ref >60.00)
Glucose, Bld: 96 mg/dL (ref 70–99)
Potassium: 4.1 mEq/L (ref 3.5–5.1)
Sodium: 142 mEq/L (ref 135–145)
Total Bilirubin: 0.2 mg/dL (ref 0.2–1.2)
Total Protein: 7.4 g/dL (ref 6.0–8.3)

## 2018-12-03 LAB — LIPID PANEL
Cholesterol: 209 mg/dL — ABNORMAL HIGH (ref 0–200)
HDL: 52.4 mg/dL (ref >39.00)
LDL Cholesterol: 134 mg/dL — ABNORMAL HIGH (ref 0–99)
NonHDL: 156.35
Total CHOL/HDL Ratio: 4
Triglycerides: 113 mg/dL (ref 0.0–149.0)
VLDL: 22.6 mg/dL (ref 0.0–40.0)

## 2018-12-03 LAB — CBC
HCT: 39.8 % (ref 36.0–46.0)
Hemoglobin: 13.4 g/dL (ref 12.0–15.0)
MCHC: 33.7 g/dL (ref 30.0–36.0)
MCV: 83.8 fl (ref 78.0–100.0)
Platelets: 319 10*3/uL (ref 150.0–400.0)
RBC: 4.74 Mil/uL (ref 3.87–5.11)
RDW: 13.2 % (ref 11.5–15.5)
WBC: 4.7 10*3/uL (ref 4.0–10.5)

## 2018-12-03 LAB — TSH: TSH: 1.62 u[IU]/mL (ref 0.35–4.50)

## 2018-12-03 MED ORDER — LEVOCETIRIZINE DIHYDROCHLORIDE 5 MG PO TABS: 5.0000 mg | ORAL_TABLET | Freq: Every evening | ORAL | 3 refills | Status: DC

## 2018-12-03 MED ORDER — NORGESTIM-ETH ESTRAD TRIPHASIC 0.18/0.215/0.25 MG-25 MCG PO TABS: 1.0000 | ORAL_TABLET | Freq: Every day | ORAL | 4 refills | Status: DC

## 2018-12-03 MED FILL — NORG-EE 0.18-0.215-0.25/0.0: 0.18/0.215/ | 84 days supply | Qty: 84 | Fill #0

## 2018-12-03 MED FILL — LEVOCETIRIZINE 5 MG TABLET: 5 | 90 days supply | Qty: 90 | Fill #0

## 2018-12-03 NOTE — Progress Notes (Signed)
   Subjective:   Patient ID: Shirley Wallace, female    DOB: [birth year], 48 y.o.   MRN: [id number]  HPI The patient is a 48 YO female coming in for physical. Having some perimenopausal symptoms which are bothersome.   PMH, [facility], social history reviewed and updated  Review of Systems  Constitutional: Negative.   HENT: Negative.   Eyes: Negative.   Respiratory: Negative for cough, chest tightness and shortness of breath.   Cardiovascular: Negative for chest pain, palpitations and leg swelling.  Gastrointestinal: Negative for abdominal distention, abdominal pain, constipation, diarrhea, nausea and vomiting.  Musculoskeletal: Negative.   Skin: Negative.   Neurological: Negative.   Psychiatric/Behavioral: Negative.     Objective:  Physical Exam Constitutional:      Appearance: She is well-developed.  HENT:     Head: Normocephalic and atraumatic.  Neck:     Musculoskeletal: Normal range of motion.  Cardiovascular:     Rate and Rhythm: Normal rate and regular rhythm.  Pulmonary:     Effort: Pulmonary effort is normal. No respiratory distress.     Breath sounds: Normal breath sounds. No wheezing or rales.  Abdominal:     General: Bowel sounds are normal. There is no distension.     Palpations: Abdomen is soft.     Tenderness: There is no abdominal tenderness. There is no rebound.  Skin:    General: Skin is warm and dry.  Neurological:     Mental Status: She is alert and oriented to person, place, and time.     Coordination: Coordination normal.     Vitals:   12/03/18 0930  BP: 110/80  Pulse: 72  Temp: 98.5 F (36.9 C)  TempSrc: Oral  SpO2: 98%  Weight: 153 lb (69.4 kg)  Height: 5' 2.5" (1.588 m)    Assessment & Plan:

## 2018-12-03 NOTE — Assessment & Plan Note (Signed)
Low dose OCP to help regulate symptoms.

## 2018-12-03 NOTE — Assessment & Plan Note (Signed)
Rx xyzal to help.

## 2018-12-03 NOTE — Assessment & Plan Note (Signed)
Flu shot with work yearly. Tetanus up to date. Mammogram up to date with gyn, pap smear up to date with gyn. Counseled about sun safety and mole surveillance. Counseled about the dangers of distracted driving. Given 10 year screening recommendations.

## 2018-12-03 NOTE — Patient Instructions (Addendum)
We will check the labs today and sent in the xyzal and the birth control pills.   Health Maintenance, Female Adopting a healthy lifestyle and getting preventive care are important in promoting health and wellness. Ask your health care provider about:  The right schedule for you to have regular tests and exams.  Things you can do on your own to prevent diseases and keep yourself healthy. What should I know about diet, weight, and exercise? Eat a healthy diet   Eat a diet that includes plenty of vegetables, fruits, low-fat dairy products, and lean protein.  Do not eat a lot of foods that are high in solid fats, added sugars, or sodium. Maintain a healthy weight Body mass index (BMI) is used to identify weight problems. It estimates body fat based on height and weight. Your health care provider can help determine your BMI and help you achieve or maintain a healthy weight. Get regular exercise Get regular exercise. This is one of the most important things you can do for your health. Most adults should:  Exercise for at least 150 minutes each week. The exercise should increase your heart rate and make you sweat (moderate-intensity exercise).  Do strengthening exercises at least twice a week. This is in addition to the moderate-intensity exercise.  Spend less time sitting. Even light physical activity can be beneficial. Watch cholesterol and blood lipids Have your blood tested for lipids and cholesterol at 46 years of age, then have this test every 5 years. Have your cholesterol levels checked more often if:  Your lipid or cholesterol levels are high.  You are older than 46 years of age.  You are at high risk for heart disease. What should I know about cancer screening? Depending on your health history and family history, you may need to have cancer screening at various ages. This may include screening for:  Breast cancer.  Cervical cancer.  Colorectal cancer.  Skin cancer.   Lung cancer. What should I know about heart disease, diabetes, and high blood pressure? Blood pressure and heart disease  High blood pressure causes heart disease and increases the risk of stroke. This is more likely to develop in people who have high blood pressure readings, are of African descent, or are overweight.  Have your blood pressure checked: ? Every 3-5 years if you are 63-63 years of age. ? Every year if you are 69 years old or older. Diabetes Have regular diabetes screenings. This checks your fasting blood sugar level. Have the screening done:  Once every three years after age 59 if you are at a normal weight and have a low risk for diabetes.  More often and at a younger age if you are overweight or have a high risk for diabetes. What should I know about preventing infection? Hepatitis B If you have a higher risk for hepatitis B, you should be screened for this virus. Talk with your health care provider to find out if you are at risk for hepatitis B infection. Hepatitis C Testing is recommended for:  Everyone born from [birth year] through 1963.  Anyone with known risk factors for hepatitis C. Sexually transmitted infections (STIs)  Get screened for STIs, including gonorrhea and chlamydia, if: ? You are sexually active and are younger than 46 years of age. ? You are older than 46 years of age and your health care provider tells you that you are at risk for this type of infection. ? Your sexual activity has changed since you were  last screened, and you are at increased risk for chlamydia or gonorrhea. Ask your health care provider if you are at risk.  Ask your health care provider about whether you are at high risk for HIV. Your health care provider may recommend a prescription medicine to help prevent HIV infection. If you choose to take medicine to prevent HIV, you should first get tested for HIV. You should then be tested every 3 months for as long as you are taking the  medicine. Pregnancy  If you are about to stop having your period (premenopausal) and you may become pregnant, seek counseling before you get pregnant.  Take 400 to 800 micrograms (mcg) of folic acid every day if you become pregnant.  Ask for birth control (contraception) if you want to prevent pregnancy. Osteoporosis and menopause Osteoporosis is a disease in which the bones lose minerals and strength with aging. This can result in bone fractures. If you are 62 years old or older, or if you are at risk for osteoporosis and fractures, ask your health care provider if you should:  Be screened for bone loss.  Take a calcium or vitamin D supplement to lower your risk of fractures.  Be given hormone replacement therapy (HRT) to treat symptoms of menopause. Follow these instructions at home: Lifestyle  Do not use any products that contain nicotine or tobacco, such as cigarettes, e-cigarettes, and chewing tobacco. If you need help quitting, ask your health care provider.  Do not use street drugs.  Do not share needles.  Ask your health care provider for help if you need support or information about quitting drugs. Alcohol use  Do not drink alcohol if: ? Your health care provider tells you not to drink. ? You are pregnant, may be pregnant, or are planning to become pregnant.  If you drink alcohol: ? Limit how much you use to 0-1 drink a day. ? Limit intake if you are breastfeeding.  Be aware of how much alcohol is in your drink. In the [place] one drink equals one 12 oz bottle of beer (355 mL), one 5 oz glass of wine (148 mL), or one 1 oz glass of hard liquor (44 mL). General instructions  Schedule regular health, dental, and eye exams.  Stay current with your vaccines.  Tell your health care provider if: ? You often feel depressed. ? You have ever been abused or do not feel safe at home. Summary  Adopting a healthy lifestyle and getting preventive care are important in  promoting health and wellness.  Follow your health care provider's instructions about healthy diet, exercising, and getting tested or screened for diseases.  Follow your health care provider's instructions on monitoring your cholesterol and blood pressure. This information is not intended to replace advice given to you by your health care provider. Make sure you discuss any questions you have with your health care provider. Document Released: 11/01/2010 Document Revised: 04/11/2018 Document Reviewed: 04/11/2018 Elsevier Patient Education  2020 [employer].

## 2018-12-07 ENCOUNTER — Encounter: Payer: Self-pay | Admitting: Internal Medicine

## 2018-12-07 LAB — CHG OBSTETRIC PANEL: HBsAg Screen: NEGATIVE

## 2018-12-07 LAB — HEPATITIS C ANTIBODY: Hep C Virus Ab: <0.1

## 2018-12-07 NOTE — Progress Notes (Signed)
Abstracted and sent to scan  

## 2019-03-18 MED FILL — TRI-LO-SPRINTEC TABLET: 0.18/0.215/ | 84 days supply | Qty: 84 | Fill #1

## 2019-03-18 MED FILL — LEVOCETIRIZINE 5 MG TABLET: 5 | 90 days supply | Qty: 90 | Fill #1

## 2019-04-30 ENCOUNTER — Other Ambulatory Visit: Payer: Self-pay | Admitting: Internal Medicine

## 2019-04-30 MED ORDER — VITAMIN D3 125 MCG (5000 UT) PO CAPS: 1.0000 | ORAL_CAPSULE | Freq: Every day | ORAL | 1 refills | Status: DC

## 2019-04-30 NOTE — Telephone Encounter (Signed)
Medication Refill - Medication: Cholecalciferol (VITAMIN D3) 125 MCG (5000 UT) CAPS  Has the patient contacted their pharmacy? Yes.   (Agent: If no, request that the patient contact the pharmacy for the refill.) (Agent: If yes, when and what did the pharmacy advise?)  Preferred Pharmacy (with phone number or street name): [facility], [place]: Please be advised that RX refills may take up to 3 business days. We ask that you follow-up with your pharmacy.

## 2019-04-30 NOTE — Telephone Encounter (Signed)
Requested medication (s) are due for refill today: yes  Requested medication (s) are on the active medication list: yes  Last refill:  08/17/2018  Future visit scheduled: no  Notes to clinic:  This refill cannot be delegated    Requested Prescriptions  Pending Prescriptions Disp Refills   Cholecalciferol (VITAMIN D3) 125 MCG (5000 UT) CAPS 90 capsule 1    Sig: Take 1 capsule (5,000 Units total) by mouth daily.      Endocrinology:  Vitamins - Vitamin D Supplementation Failed - 04/30/2019 10:08 AM      Failed - 50,000 IU strengths are not delegated      Failed - Phosphate in normal range and within 360 days    No results found for: PHOS        Failed - Vitamin D in normal range and within 360 days    VITD  Date Value Ref Range Status  12/03/2018 31.48 30.00 - 100.00 ng/mL Final          Passed - Ca in normal range and within 360 days    Calcium  Date Value Ref Range Status  12/03/2018 9.5 8.4 - 10.5 mg/dL Final          Passed - Valid encounter within last 12 months    Recent Outpatient Visits           4 months ago Routine general medical examination at a health care facility   [place], Elizabeth A, MD   1 year ago [place], Elizabeth A, MD   1 year ago Routine general medical examination at a health care facility   [place], Elizabeth A, MD   2 years ago Tinea corporis   [place], Charlene Brooke, NP   2 years ago Encounter for preventative adult health care exam with abnormal findings   [place], Charlene Brooke, NP

## 2019-05-13 DIAGNOSIS — Z1159 Encounter for screening for other viral diseases: Secondary | ICD-10-CM | POA: Diagnosis not present

## 2019-05-13 DIAGNOSIS — Z1151 Encounter for screening for human papillomavirus (HPV): Secondary | ICD-10-CM | POA: Diagnosis not present

## 2019-05-13 DIAGNOSIS — Z01419 Encounter for gynecological examination (general) (routine) without abnormal findings: Secondary | ICD-10-CM | POA: Diagnosis not present

## 2019-05-13 DIAGNOSIS — Z6829 Body mass index (BMI) 29.0-29.9, adult: Secondary | ICD-10-CM | POA: Diagnosis not present

## 2019-05-13 DIAGNOSIS — Z114 Encounter for screening for human immunodeficiency virus [HIV]: Secondary | ICD-10-CM | POA: Diagnosis not present

## 2019-05-13 DIAGNOSIS — Z113 Encounter for screening for infections with a predominantly sexual mode of transmission: Secondary | ICD-10-CM | POA: Diagnosis not present

## 2019-05-22 DIAGNOSIS — Z1211 Encounter for screening for malignant neoplasm of colon: Secondary | ICD-10-CM | POA: Diagnosis not present

## 2019-05-22 MED FILL — SUPREP BOWEL PREP KIT: 17.5-3.13-1 | 1 days supply | Qty: 354 | Fill #0

## 2019-05-24 MED FILL — SUPREP BOWEL PREP KIT: 17.5-3.13-1 | 1 days supply | Qty: 354 | Fill #0

## 2019-05-28 DIAGNOSIS — Z1211 Encounter for screening for malignant neoplasm of colon: Secondary | ICD-10-CM | POA: Diagnosis not present

## 2019-06-04 DIAGNOSIS — H524 Presbyopia: Secondary | ICD-10-CM | POA: Diagnosis not present

## 2019-07-17 MED FILL — LEVOCETIRIZINE 5 MG TABLET: 5 | 90 days supply | Qty: 90 | Fill #2

## 2019-08-05 ENCOUNTER — Encounter: Payer: Self-pay | Admitting: Family Medicine

## 2019-08-05 ENCOUNTER — Ambulatory Visit: Payer: Self-pay

## 2019-08-05 ENCOUNTER — Ambulatory Visit: Payer: 59 | Admitting: Family Medicine

## 2019-08-05 ENCOUNTER — Other Ambulatory Visit: Payer: Self-pay

## 2019-08-05 VITALS — BP 108/82 | HR 66 | Ht 62.5 in | Wt 155.0 lb

## 2019-08-05 DIAGNOSIS — M216X1 Other acquired deformities of right foot: Secondary | ICD-10-CM

## 2019-08-05 DIAGNOSIS — M79674 Pain in right toe(s): Secondary | ICD-10-CM

## 2019-08-05 DIAGNOSIS — M659 Synovitis and tenosynovitis, unspecified: Secondary | ICD-10-CM

## 2019-08-05 DIAGNOSIS — G8929 Other chronic pain: Secondary | ICD-10-CM

## 2019-08-05 DIAGNOSIS — M216X9 Other acquired deformities of unspecified foot: Secondary | ICD-10-CM | POA: Insufficient documentation

## 2019-08-05 MED ORDER — VITAMIN D (ERGOCALCIFEROL) 1.25 MG (50000 UNIT) PO CAPS: 50000.0000 [IU] | ORAL_CAPSULE | ORAL | 0 refills | Status: DC

## 2019-08-05 MED FILL — VIT D2 1.25 MG (50,000 UNIT: 1.25 MG | 56 days supply | Qty: 8 | Fill #0

## 2019-08-05 NOTE — Assessment & Plan Note (Signed)
Synovitis of the large toe noted.  Discussed icing regimen with home exercise, discussed which activities to do which wants to avoid.  Patient is to increase activity slowly.  Patient does have breakdown of the transverse arch that I think is contributing.  Discussed over-the-counter medications, once weekly vitamin D secondary to patient potential stress reaction noted on ultrasound.  Follow-up again 4 to 8 weeks.  Worsening pain consider injection

## 2019-08-05 NOTE — Assessment & Plan Note (Signed)
Transverse arch breakdown, causing a lot of stress on the first metatarsal.  Discussed icing regimen and home exercise, increase activity slowly.  Follow-up again 4 to 8 weeks

## 2019-08-05 NOTE — Progress Notes (Signed)
[name] [address] [place] Phone: [phone] Subjective:   I Shirley Wallace am serving as a Education administrator for Dr. Hulan Saas.  This visit occurred during the SARS-CoV-2 public health emergency.  Safety protocols were in place, including screening questions prior to the visit, additional usage of staff PPE, and extensive cleaning of exam room while observing appropriate contact time as indicated for disinfecting solutions.   I'm seeing this patient by the request  of:  Hoyt Koch, MD  CC: Right toe pain  [id number]  Shirley Wallace is a 49 y.o. female coming in with complaint of right first toe pain. Remembers when she was younger she tripped and hit her toe on the root of a tree. Feels like she is stepping on a bone. Patient sates she has been walking and adding pressure to the area.   Onset- Chronic  Location Turf toe  Duration- throughout the day  Character- sore, achy, sharp, throbbing Aggravating factors- walking  Reliving factors-  Therapies tried- foot massage  Severity-  7/10     Past Medical History:  Diagnosis Date  . Allergy   . Arthritis   . Chronic headaches    Past Surgical History:  Procedure Laterality Date  . CESAREAN SECTION    . MYOMECTOMY     Social History   Socioeconomic History  . Marital status: Married    Spouse name: Not on file  . Number of children: 2  . Years of education: Not on file  . Highest education level: Not on file  Occupational History  . Occupation: Quarry manager  Tobacco Use  . Smoking status: Never Smoker  . Smokeless tobacco: Never Used  Substance and Sexual Activity  . Alcohol use: Yes    Alcohol/week: 2.0 standard drinks    Types: 2 Shots of liquor per week    Comment: occasionally  . Drug use: No  . Sexual activity: Yes  Other Topics Concern  . Not on file  Social History Narrative  . Not on file   Social Determinants of Health   Financial Resource  Strain:   . Difficulty of Paying Living Expenses:   Food Insecurity:   . Worried About Charity fundraiser in the Last Year:   . Arboriculturist in the Last Year:   Transportation Needs:   . Film/video editor (Medical):   [name] Lack of Transportation (Non-Medical):   Physical Activity:   . Days of Exercise per Week:   . Minutes of Exercise per Session:   Stress:   . Feeling of Stress :   Social Connections:   . Frequency of Communication with Friends and Family:   . Frequency of Social Gatherings with Friends and Family:   . Attends Religious Services:   . Active Member of Clubs or Organizations:   . Attends Archivist Meetings:   [name] Marital Status:    Allergies  Allergen Reactions  . Aspirin Swelling    Eye swelling   Family History  Problem Relation Age of Onset  . Arthritis Mother   . Allergies Father   . Allergies Paternal Grandfather     Current Outpatient Medications (Endocrine & Metabolic):  [name]  Norgestimate-Ethinyl Estradiol Triphasic (ORTHO TRI-CYCLEN LO) 0.18/0.215/0.25 MG-25 MCG tab, Take 1 tablet by mouth daily.   Current Outpatient Medications (Respiratory):  .  diphenhydrAMINE (BENADRYL) 25 MG tablet, Take 25 mg by mouth every 6 (six) hours as needed. [name]  levocetirizine (XYZAL) 5 MG tablet, Take 1 tablet (5 mg total) by mouth every evening.   Current Outpatient Medications (Hematological):  .  folic acid (FOLVITE) A999333 MCG tablet, Take 400 mcg by mouth daily.  Current Outpatient Medications (Other):  [name]  Cholecalciferol (VITAMIN D3) 125 MCG (5000 UT) CAPS, Take 1 capsule (5,000 Units total) by mouth daily. [name]  ketoconazole (NIZORAL) 2 % cream, Apply 1 application topically daily. .  Melatonin 5 MG TABS, Take by mouth. .  Multiple Vitamin (MULTIVITAMIN) capsule, Take 1 capsule by mouth daily. .  Nutritional Supplements (JUICE PLUS FIBRE PO), Take by mouth. .  nystatin-triamcinolone (MYCOLOG II) cream, Apply 1 application topically 2 (two) times  daily. .  Prenat w/o A-FeCbn-DSS-FA-DHA (PRENAISSANCE PLUS) 28-1-250 MG CAPS,  .  triamcinolone ointment (KENALOG) 0.5 %, Apply 1 application topically 2 (two) times daily. .  Vitamin D, Ergocalciferol, (DRISDOL) 1.25 MG (50000 UNIT) CAPS capsule, Take 1 capsule (50,000 Units total) by mouth every 7 (seven) days.   Reviewed prior external information including notes and imaging from  primary care provider As well as notes that were available from care everywhere and other healthcare systems.  Past medical history, social, surgical and family history all reviewed in electronic medical record.  No pertanent information unless stated regarding to the chief complaint.   Review of Systems:  No headache, visual changes, nausea, vomiting, diarrhea, constipation, dizziness, abdominal pain, skin rash, fevers, chills, night sweats, weight loss, swollen lymph nodes, body aches, joint swelling, chest pain, shortness of breath, mood changes. POSITIVE muscle aches  Objective  Blood pressure 108/82, pulse 66, height 5' 2.5" (1.588 m), weight 155 lb (70.3 kg), SpO2 98 %.   General: No apparent distress alert and oriented x3 mood and affect normal, dressed appropriately.  HEENT: Pupils equal, extraocular movements intact  Respiratory: Patient's speak in full sentences and does not appear short of breath  Cardiovascular: No lower extremity edema, non tender, no erythema  Neuro: Cranial nerves II through XII are intact, neurovascularly intact in all extremities with 2+ DTRs and 2+ pulses.  Gait normal with good balance and coordination.  MSK:  Non tender with full range of motion and good stability and symmetric strength and tone of shoulders, elbows, wrist, hip, knee and ankles bilaterally.  Foot exam shows the patient does have breakdown of the transverse arch noted.  Patient does have some mild bunion and bunionette formation of the first and fifth toes respectively.  Patient does have mild hammering of the  second toe.  No significant splaying.  Patient does have some mild limited motion of the flexor hallux compared to the contralateral side.  Limited musculoskeletal ultrasound was performed and interpreted by Lyndal Pulley  Limited ultrasound of patient's first toe shows the patient does have a mild synovitis noted.  Patient does have a very small cortical irregularity proximal to the MTP joint.  Mild increase in Doppler flow.  Cortex though does appear to still be intact. Impression: Synovitis of the first MTP with possible early stress reaction   Impression and Recommendations:     This case required medical decision making of moderate complexity. The above documentation has been reviewed and is accurate and complete Lyndal Pulley, DO       Note: This dictation was prepared with Dragon dictation along with smaller phrase technology. Any transcriptional errors that result from this process are unintentional.

## 2019-08-05 NOTE — Patient Instructions (Signed)
Good to see you.  Ice 20 minutes 2 times daily. Usually after activity and before bed. Rigid sole shoes Recovery sandals in the house [employer]enco orthotics "total support" online would be great  pennsaid pinkie amount topically 2 times daily as needed.  Once weekly vitamin D  Stay active but Ice after activity  See me agai nin 6 weeks

## 2019-09-12 ENCOUNTER — Other Ambulatory Visit: Payer: Self-pay | Admitting: Internal Medicine

## 2019-09-12 DIAGNOSIS — Z1231 Encounter for screening mammogram for malignant neoplasm of breast: Secondary | ICD-10-CM

## 2019-09-16 ENCOUNTER — Ambulatory Visit: Payer: 59 | Admitting: Family Medicine

## 2019-09-16 ENCOUNTER — Encounter: Payer: Self-pay | Admitting: Family Medicine

## 2019-09-16 ENCOUNTER — Other Ambulatory Visit: Payer: Self-pay

## 2019-09-16 ENCOUNTER — Ambulatory Visit: Payer: Self-pay

## 2019-09-16 VITALS — BP 126/76 | HR 64 | Ht 62.5 in | Wt 155.0 lb

## 2019-09-16 DIAGNOSIS — M659 Synovitis and tenosynovitis, unspecified: Secondary | ICD-10-CM | POA: Diagnosis not present

## 2019-09-16 DIAGNOSIS — M79674 Pain in right toe(s): Secondary | ICD-10-CM | POA: Diagnosis not present

## 2019-09-16 NOTE — Assessment & Plan Note (Signed)
Patient given injection today.  Tolerated procedure well, discussed icing regimen with home exercise, which activities to do with posterior avoid.  Increase activity slowly over the course the next several weeks.  Patient was having a chronic problem.  Discussed topical anti-inflammatories and vitamin D supplementation still.  Follow-up 6 to 8 weeks

## 2019-09-16 NOTE — Progress Notes (Signed)
[place] [address] [place] Phone: [phone] Subjective:   I Shirley Wallace am serving as a Education administrator for Dr. Hulan Saas.  This visit occurred during the SARS-CoV-2 public health emergency.  Safety protocols were in place, including screening questions prior to the visit, additional usage of staff PPE, and extensive cleaning of exam room while observing appropriate contact time as indicated for disinfecting solutions.   I'm seeing this patient by the request  of:  Hoyt Koch, MD  CC: Toe pain follow-up  [id number]   08/05/2019 Synovitis of the large toe noted.  Discussed icing regimen with home exercise, discussed which activities to do which wants to avoid.  Patient is to increase activity slowly.  Patient does have breakdown of the transverse arch that I think is contributing.  Discussed over-the-counter medications, once weekly vitamin D secondary to patient potential stress reaction noted on ultrasound.  Follow-up again 4 to 8 weeks.  Worsening pain consider injection Update 09/16/2019 Shirley Wallace is a 49 y.o. female coming in with complaint of right great toe synovitis. Patient states the toe is getting better but she still feels the pain.  Patient states that approximately 60% better.  She does notice if she walks barefoot that the pain seems to increase fairly quickly.      Past Medical History:  Diagnosis Date  . Allergy   . Arthritis   . Chronic headaches    Past Surgical History:  Procedure Laterality Date  . CESAREAN SECTION    . MYOMECTOMY     Social History   Socioeconomic History  . Marital status: Married    Spouse name: Not on file  . Number of children: 2  . Years of education: Not on file  . Highest education level: Not on file  Occupational History  . Occupation: Quarry manager  Tobacco Use  . Smoking status: Never Smoker  . Smokeless tobacco: Never Used  Substance and Sexual Activity  .  Alcohol use: Yes    Alcohol/week: 2.0 standard drinks    Types: 2 Shots of liquor per week    Comment: occasionally  . Drug use: No  . Sexual activity: Yes  Other Topics Concern  . Not on file  Social History Narrative  . Not on file   Social Determinants of Health   Financial Resource Strain:   . Difficulty of Paying Living Expenses:   Food Insecurity:   . Worried About Charity fundraiser in the Last Year:   . Arboriculturist in the Last Year:   Transportation Needs:   . Film/video editor (Medical):   [name] Lack of Transportation (Non-Medical):   Physical Activity:   . Days of Exercise per Week:   . Minutes of Exercise per Session:   Stress:   . Feeling of Stress :   Social Connections:   . Frequency of Communication with Friends and Family:   . Frequency of Social Gatherings with Friends and Family:   . Attends Religious Services:   . Active Member of Clubs or Organizations:   . Attends Archivist Meetings:   [name] Marital Status:    Allergies  Allergen Reactions  . Aspirin Swelling    Eye swelling   Family History  Problem Relation Age of Onset  . Arthritis Mother   . Allergies Father   . Allergies Paternal Grandfather     Current Outpatient Medications (Endocrine & Metabolic):  .  Norgestimate-Ethinyl Estradiol Triphasic (ORTHO TRI-CYCLEN LO) 0.18/0.215/0.25 MG-25 MCG tab, Take 1 tablet by mouth daily.   Current Outpatient Medications (Respiratory):  .  diphenhydrAMINE (BENADRYL) 25 MG tablet, Take 25 mg by mouth every 6 (six) hours as needed. [name]  levocetirizine (XYZAL) 5 MG tablet, Take 1 tablet (5 mg total) by mouth every evening.   Current Outpatient Medications (Hematological):  .  folic acid (FOLVITE) A999333 MCG tablet, Take 400 mcg by mouth daily.  Current Outpatient Medications (Other):  [name]  Cholecalciferol (VITAMIN D3) 125 MCG (5000 UT) CAPS, Take 1 capsule (5,000 Units total) by mouth daily. [name]  ketoconazole (NIZORAL) 2 % cream, Apply 1  application topically daily. .  Melatonin 5 MG TABS, Take by mouth. .  Multiple Vitamin (MULTIVITAMIN) capsule, Take 1 capsule by mouth daily. .  Nutritional Supplements (JUICE PLUS FIBRE PO), Take by mouth. .  nystatin-triamcinolone (MYCOLOG II) cream, Apply 1 application topically 2 (two) times daily. .  Prenat w/o A-FeCbn-DSS-FA-DHA (PRENAISSANCE PLUS) 28-1-250 MG CAPS,  .  triamcinolone ointment (KENALOG) 0.5 %, Apply 1 application topically 2 (two) times daily. .  Vitamin D, Ergocalciferol, (DRISDOL) 1.25 MG (50000 UNIT) CAPS capsule, Take 1 capsule (50,000 Units total) by mouth every 7 (seven) days.   Reviewed prior external information including notes and imaging from  primary care provider As well as notes that were available from care everywhere and other healthcare systems.  Past medical history, social, surgical and family history all reviewed in electronic medical record.  No pertanent information unless stated regarding to the chief complaint.   Review of Systems:  No headache, visual changes, nausea, vomiting, diarrhea, constipation, dizziness, abdominal pain, skin rash, fevers, chills, night sweats, weight loss, swollen lymph nodes, body aches, joint swelling, chest pain, shortness of breath, mood changes. POSITIVE muscle aches  Objective  Blood pressure 126/76, pulse 64, height 5' 2.5" (1.588 m), weight 155 lb (70.3 kg), SpO2 99 %.   General: No apparent distress alert and oriented x3 mood and affect normal, dressed appropriately.  HEENT: Pupils equal, extraocular movements intact  Respiratory: Patient's speak in full sentences and does not appear short of breath  Cardiovascular: No lower extremity edema, non tender, no erythema  Neuro: Cranial nerves II through XII are intact, neurovascularly intact in all extremities with 2+ DTRs and 2+ pulses.  Gait normal with good balance and coordination.  MSK:  N Right foot exam shows the patient still has swelling over the first  MTP joint.  Does have some mild hallux limitus secondary to the swelling.  Tender to palpation in the area.  Procedure: Real-time Ultrasound Guided Injection of right sided MTP Device: GE Logiq Q7 Ultrasound guided injection is preferred based studies that show increased duration, increased effect, greater accuracy, decreased procedural pain, increased response rate, and decreased cost with ultrasound guided versus blind injection.  Verbal informed consent obtained.  Time-out conducted.  Noted no overlying erythema, induration, or other signs of local infection.  Skin prepped in a sterile fashion.  Local anesthesia: Topical Ethyl chloride.  With sterile technique and under real time ultrasound guidance: With a 25-gauge half inch needle injected into the right MTP joint of the first toe with a total of 0.5 cc of 0.5% Marcaine and 0.5 cc of Kenalog 40 mg/mL Completed without difficulty  Pain immediately resolved suggesting accurate placement of the medication.  Advised to call if fevers/chills, erythema, induration, drainage, or persistent bleeding.  Images permanently stored and available for review in the ultrasound unit.  Impression: Technically successful ultrasound guided injection.    Impression and Recommendations:     This case required medical decision making of moderate complexity. The above documentation has been reviewed and is accurate and complete Lyndal Pulley, DO       Note: This dictation was prepared with Dragon dictation along with smaller phrase technology. Any transcriptional errors that result from this process are unintentional.

## 2019-09-16 NOTE — Patient Instructions (Signed)
Good to see you  Ice 20 minutes 2 times daily. Usually after activity and before bed. Rigid shoe as much as possible Stay active See me again in 6-8 weeks to make sure you are doing better

## 2019-09-23 ENCOUNTER — Telehealth: Payer: Self-pay | Admitting: Family Medicine

## 2019-09-23 NOTE — Telephone Encounter (Signed)
Spoke with patient. She wants to begin walking again. Recommended that she wear a rigid soled shoe for walking. Provided patient with names of local shoe stores that can help her find rigid soled shoes.

## 2019-09-23 NOTE — Telephone Encounter (Signed)
Patient called asking to speak to you with follow up questions from her recent visit and what to expect after the injection she had.

## 2019-10-15 ENCOUNTER — Ambulatory Visit
Admission: RE | Admit: 2019-10-15 | Discharge: 2019-10-15 | Disposition: A | Discharge status: Home / Self Care | Payer: 59 | Source: Ambulatory Visit | Attending: Internal Medicine | Admitting: Internal Medicine

## 2019-10-15 ENCOUNTER — Other Ambulatory Visit: Payer: Self-pay

## 2019-10-15 DIAGNOSIS — Z1231 Encounter for screening mammogram for malignant neoplasm of breast: Secondary | ICD-10-CM

## 2019-10-28 ENCOUNTER — Ambulatory Visit: Payer: 59 | Admitting: Family Medicine

## 2019-10-31 ENCOUNTER — Other Ambulatory Visit: Payer: Self-pay | Admitting: Family Medicine

## 2019-10-31 MED FILL — LEVOCETIRIZINE 5 MG TABLET: 5 | 90 days supply | Qty: 90 | Fill #3

## 2019-11-01 MED FILL — TRI-LO-SPRINTEC TABLET: 0.18/0.215/ | 84 days supply | Qty: 84 | Fill #2

## 2019-11-27 ENCOUNTER — Ambulatory Visit: Payer: 59 | Admitting: Family Medicine

## 2019-12-05 ENCOUNTER — Encounter: Payer: 59 | Admitting: Internal Medicine

## 2019-12-09 ENCOUNTER — Ambulatory Visit (INDEPENDENT_AMBULATORY_CARE_PROVIDER_SITE_OTHER): Payer: 59 | Admitting: Internal Medicine

## 2019-12-09 ENCOUNTER — Encounter: Payer: Self-pay | Admitting: Internal Medicine

## 2019-12-09 ENCOUNTER — Other Ambulatory Visit: Payer: Self-pay

## 2019-12-09 VITALS — BP 114/72 | HR 74 | Temp 99.2°F | Ht 62.5 in | Wt 150.0 lb

## 2019-12-09 DIAGNOSIS — Z0000 Encounter for general adult medical examination without abnormal findings: Secondary | ICD-10-CM

## 2019-12-09 DIAGNOSIS — J3089 Other allergic rhinitis: Secondary | ICD-10-CM | POA: Diagnosis not present

## 2019-12-09 DIAGNOSIS — J302 Other seasonal allergic rhinitis: Secondary | ICD-10-CM

## 2019-12-09 MED ORDER — MONTELUKAST SODIUM 10 MG PO TABS: 10.0000 mg | ORAL_TABLET | Freq: Every day | ORAL | 3 refills | Status: DC

## 2019-12-09 MED FILL — MONTELUKAST SOD 10 MG TAB: 10 | 90 days supply | Qty: 90 | Fill #0

## 2019-12-09 NOTE — Assessment & Plan Note (Signed)
Flu shot yearly. Tetanus up to date. Mammogram up to date, pap smear up to date. Counseled about sun safety and mole surveillance. Counseled about the dangers of distracted driving. Given 10 year screening recommendations.

## 2019-12-09 NOTE — Assessment & Plan Note (Signed)
Rx singulair to help with allergies. Previously getting good relief from xyzal but not lately.

## 2019-12-09 NOTE — Progress Notes (Signed)
   Subjective:   Patient ID: Shirley Wallace, female    DOB: 26-Mar-1971, 49 y.o.   MRN: [id number]  HPI The patient is a 49 YO female coming in for physical.   PMH, [place], social history reviewed and updated  Review of Systems  Constitutional: Negative.   HENT: Negative.   Eyes: Negative.   Respiratory: Negative for cough, chest tightness and shortness of breath.   Cardiovascular: Negative for chest pain, palpitations and leg swelling.  Gastrointestinal: Negative for abdominal distention, abdominal pain, constipation, diarrhea, nausea and vomiting.  Musculoskeletal: Negative.   Skin: Negative.   Neurological: Negative.   Psychiatric/Behavioral: Negative.     Objective:  Physical Exam Constitutional:      Appearance: She is well-developed.  HENT:     Head: Normocephalic and atraumatic.  Cardiovascular:     Rate and Rhythm: Normal rate and regular rhythm.  Pulmonary:     Effort: Pulmonary effort is normal. No respiratory distress.     Breath sounds: Normal breath sounds. No wheezing or rales.  Abdominal:     General: Bowel sounds are normal. There is no distension.     Palpations: Abdomen is soft.     Tenderness: There is no abdominal tenderness. There is no rebound.  Musculoskeletal:     Cervical back: Normal range of motion.  Skin:    General: Skin is warm and dry.  Neurological:     Mental Status: She is alert and oriented to person, place, and time.     Coordination: Coordination normal.     Vitals:   12/09/19 1259  BP: 114/72  Pulse: 74  Temp: 99.2 F (37.3 C)  TempSrc: Oral  SpO2: 98%  Weight: 150 lb (68 kg)  Height: 5' 2.5" (1.588 m)    This visit occurred during the SARS-CoV-2 public health emergency.  Safety protocols were in place, including screening questions prior to the visit, additional usage of staff PPE, and extensive cleaning of exam room while observing appropriate contact time as indicated for disinfecting solutions.   Assessment & Plan:

## 2019-12-09 NOTE — Patient Instructions (Addendum)
Health Maintenance, Female Adopting a healthy lifestyle and getting preventive care are important in promoting health and wellness. Ask your health care provider about:  The right schedule for you to have regular tests and exams.  Things you can do on your own to prevent diseases and keep yourself healthy. What should I know about diet, weight, and exercise? Eat a healthy diet   Eat a diet that includes plenty of vegetables, fruits, low-fat dairy products, and lean protein.  Do not eat a lot of foods that are high in solid fats, added sugars, or sodium. Maintain a healthy weight Body mass index (BMI) is used to identify weight problems. It estimates body fat based on height and weight. Your health care provider can help determine your BMI and help you achieve or maintain a healthy weight. Get regular exercise Get regular exercise. This is one of the most important things you can do for your health. Most adults should:  Exercise for at least 150 minutes each week. The exercise should increase your heart rate and make you sweat (moderate-intensity exercise).  Do strengthening exercises at least twice a week. This is in addition to the moderate-intensity exercise.  Spend less time sitting. Even light physical activity can be beneficial. Watch cholesterol and blood lipids Have your blood tested for lipids and cholesterol at 47 years of age, then have this test every 5 years. Have your cholesterol levels checked more often if:  Your lipid or cholesterol levels are high.  You are older than 47 years of age.  You are at high risk for heart disease. What should I know about cancer screening? Depending on your health history and family history, you may need to have cancer screening at various ages. This may include screening for:  Breast cancer.  Cervical cancer.  Colorectal cancer.  Skin cancer.  Lung cancer. What should I know about heart disease, diabetes, and high blood  pressure? Blood pressure and heart disease  High blood pressure causes heart disease and increases the risk of stroke. This is more likely to develop in people who have high blood pressure readings, are of African descent, or are overweight.  Have your blood pressure checked: ? Every 3-5 years if you are 18-39 years of age. ? Every year if you are 40 years old or older. Diabetes Have regular diabetes screenings. This checks your fasting blood sugar level. Have the screening done:  Once every three years after age 40 if you are at a normal weight and have a low risk for diabetes.  More often and at a younger age if you are overweight or have a high risk for diabetes. What should I know about preventing infection? Hepatitis B If you have a higher risk for hepatitis B, you should be screened for this virus. Talk with your health care provider to find out if you are at risk for hepatitis B infection. Hepatitis C Testing is recommended for:  Everyone born from 1945 through 1965.  Anyone with known risk factors for hepatitis C. Sexually transmitted infections (STIs)  Get screened for STIs, including gonorrhea and chlamydia, if: ? You are sexually active and are younger than 47 years of age. ? You are older than 47 years of age and your health care provider tells you that you are at risk for this type of infection. ? Your sexual activity has changed since you were last screened, and you are at increased risk for chlamydia or gonorrhea. Ask your health care provider if   you are at risk.  Ask your health care provider about whether you are at high risk for HIV. Your health care provider may recommend a prescription medicine to help prevent HIV infection. If you choose to take medicine to prevent HIV, you should first get tested for HIV. You should then be tested every 3 months for as long as you are taking the medicine. Pregnancy  If you are about to stop having your period (premenopausal) and  you may become pregnant, seek counseling before you get pregnant.  Take 400 to 800 micrograms (mcg) of folic acid every day if you become pregnant.  Ask for birth control (contraception) if you want to prevent pregnancy. Osteoporosis and menopause Osteoporosis is a disease in which the bones lose minerals and strength with aging. This can result in bone fractures. If you are 65 years old or older, or if you are at risk for osteoporosis and fractures, ask your health care provider if you should:  Be screened for bone loss.  Take a calcium or vitamin D supplement to lower your risk of fractures.  Be given hormone replacement therapy (HRT) to treat symptoms of menopause. Follow these instructions at home: Lifestyle  Do not use any products that contain nicotine or tobacco, such as cigarettes, e-cigarettes, and chewing tobacco. If you need help quitting, ask your health care provider.  Do not use street drugs.  Do not share needles.  Ask your health care provider for help if you need support or information about quitting drugs. Alcohol use  Do not drink alcohol if: ? Your health care provider tells you not to drink. ? You are pregnant, may be pregnant, or are planning to become pregnant.  If you drink alcohol: ? Limit how much you use to 0-1 drink a day. ? Limit intake if you are breastfeeding.  Be aware of how much alcohol is in your drink. In the [place] one drink equals one 12 oz bottle of beer (355 mL), one 5 oz glass of wine (148 mL), or one 1 oz glass of hard liquor (44 mL). General instructions  Schedule regular health, dental, and eye exams.  Stay current with your vaccines.  Tell your health care provider if: ? You often feel depressed. ? You have ever been abused or do not feel safe at home. Summary  Adopting a healthy lifestyle and getting preventive care are important in promoting health and wellness.  Follow your health care provider's instructions about healthy  diet, exercising, and getting tested or screened for diseases.  Follow your health care provider's instructions on monitoring your cholesterol and blood pressure. This information is not intended to replace advice given to you by your health care provider. Make sure you discuss any questions you have with your health care provider. Document Revised: 04/11/2018 Document Reviewed: 04/11/2018 Elsevier Patient Education  2020 [employer] Inc[employer]  

## 2019-12-10 LAB — CBC
HCT: 40.3 % (ref 35.0–45.0)
Hemoglobin: 13.4 g/dL (ref 11.7–15.5)
MCH: 27.6 pg (ref 27.0–33.0)
MCHC: 33.3 g/dL (ref 32.0–36.0)
MCV: 83.1 fL (ref 80.0–100.0)
MPV: 9.7 fL (ref 7.5–12.5)
Platelets: 304 10*3/uL (ref 140–400)
RBC: 4.85 10*6/uL (ref 3.80–5.10)
RDW: 12.3 % (ref 11.0–15.0)
WBC: 4.3 10*3/uL (ref 3.8–10.8)

## 2019-12-10 LAB — COMPREHENSIVE METABOLIC PANEL
AG Ratio: 1.6 (calc) (ref 1.0–2.5)
ALT: 14 U/L (ref 6–29)
AST: 16 U/L (ref 10–35)
Albumin: 4.4 g/dL (ref 3.6–5.1)
Alkaline phosphatase (APISO): 46 U/L (ref 31–125)
BUN: 9 mg/dL (ref 7–25)
CO2: 27 mmol/L (ref 20–32)
Calcium: 9.5 mg/dL (ref 8.6–10.2)
Chloride: 103 mmol/L (ref 98–110)
Creat: 0.91 mg/dL (ref 0.50–1.10)
Globulin: 2.7 g/dL (calc) (ref 1.9–3.7)
Glucose, Bld: 87 mg/dL (ref 65–99)
Potassium: 4.1 mmol/L (ref 3.5–5.3)
Sodium: 140 mmol/L (ref 135–146)
Total Bilirubin: 0.4 mg/dL (ref 0.2–1.2)
Total Protein: 7.1 g/dL (ref 6.1–8.1)

## 2019-12-10 LAB — LIPID PANEL
Cholesterol: 181 mg/dL (ref <200)
HDL: 51 mg/dL (ref >=50)
LDL Cholesterol (Calc): 103 mg/dL (calc) — ABNORMAL HIGH
Non-HDL Cholesterol (Calc): 130 mg/dL (calc) — ABNORMAL HIGH (ref <130)
Total CHOL/HDL Ratio: 3.5 (calc) (ref <5.0)
Triglycerides: 152 mg/dL — ABNORMAL HIGH (ref <150)

## 2019-12-10 LAB — HEMOGLOBIN A1C
Hgb A1c MFr Bld: 5.5 % of total Hgb (ref <5.7)
Mean Plasma Glucose: 111 (calc)
eAG (mmol/L): 6.2 (calc)

## 2019-12-23 ENCOUNTER — Ambulatory Visit (INDEPENDENT_AMBULATORY_CARE_PROVIDER_SITE_OTHER): Payer: 59 | Admitting: Family Medicine

## 2019-12-23 ENCOUNTER — Encounter: Payer: Self-pay | Admitting: Family Medicine

## 2019-12-23 ENCOUNTER — Other Ambulatory Visit: Payer: Self-pay

## 2019-12-23 DIAGNOSIS — M659 Synovitis and tenosynovitis, unspecified: Secondary | ICD-10-CM

## 2019-12-23 NOTE — Patient Instructions (Signed)
Shoes in the house Change walking shoes every 250 miles See me again in 3 months if you need me or as needed

## 2019-12-23 NOTE — Progress Notes (Signed)
[place] [address] [place] Phone: [phone] Subjective:   I Shirley Wallace am serving as a Education administrator for Dr. Hulan Saas.  This visit occurred during the SARS-CoV-2 public health emergency.  Safety protocols were in place, including screening questions prior to the visit, additional usage of staff PPE, and extensive cleaning of exam room while observing appropriate contact time as indicated for disinfecting solutions.   I'm seeing this patient by the request  of:  Hoyt Koch, MD  CC: Foot pain follow-up  [id number]   09/16/2019 Patient given injection today.  Tolerated procedure well, discussed icing regimen with home exercise, which activities to do with posterior avoid.  Increase activity slowly over the course the next several weeks.  Patient was having a chronic problem.  Discussed topical anti-inflammatories and vitamin D supplementation still.  Follow-up 6 to 8 weeks  Update 12/23/2019 Shirley Wallace is a 49 y.o. female coming in with complaint of right, great toe pain. Patient states she is doing well. Purchased new shoes and wear them at work and while walking.  Patient states that she is feeling 90 to 95% better.  Has not had any significant discomfort.  Has been able to increase activity slowly over the course of the next several weeks.      Past Medical History:  Diagnosis Date  . Allergy   . Arthritis   . Chronic headaches    Past Surgical History:  Procedure Laterality Date  . CESAREAN SECTION    . MYOMECTOMY     Social History   Socioeconomic History  . Marital status: Married    Spouse name: Not on file  . Number of children: 2  . Years of education: Not on file  . Highest education level: Not on file  Occupational History  . Occupation: Quarry manager  Tobacco Use  . Smoking status: Never Smoker  . Smokeless tobacco: Never Used  Substance and Sexual Activity  . Alcohol use: Yes     Alcohol/week: 2.0 standard drinks    Types: 2 Shots of liquor per week    Comment: occasionally  . Drug use: No  . Sexual activity: Yes  Other Topics Concern  . Not on file  Social History Narrative  . Not on file   Social Determinants of Health   Financial Resource Strain:   . Difficulty of Paying Living Expenses: Not on file  Food Insecurity:   . Worried About Charity fundraiser in the Last Year: Not on file  . Ran Out of Food in the Last Year: Not on file  Transportation Needs:   . Lack of Transportation (Medical): Not on file  . Lack of Transportation (Non-Medical): Not on file  Physical Activity:   . Days of Exercise per Week: Not on file  . Minutes of Exercise per Session: Not on file  Stress:   . Feeling of Stress : Not on file  Social Connections:   . Frequency of Communication with Friends and Family: Not on file  . Frequency of Social Gatherings with Friends and Family: Not on file  . Attends Religious Services: Not on file  . Active Member of Clubs or Organizations: Not on file  . Attends Archivist Meetings: Not on file  . Marital Status: Not on file   Allergies  Allergen Reactions  . Aspirin Swelling    Eye swelling   Family History  Problem Relation Age of Onset  .  Arthritis Mother   . Allergies Father   . Allergies Paternal Grandfather     Current Outpatient Medications (Endocrine & Metabolic):  [name]  Norgestimate-Ethinyl Estradiol Triphasic (ORTHO TRI-CYCLEN LO) 0.18/0.215/0.25 MG-25 MCG tab, Take 1 tablet by mouth daily.   Current Outpatient Medications (Respiratory):  .  diphenhydrAMINE (BENADRYL) 25 MG tablet, Take 25 mg by mouth every 6 (six) hours as needed. [name]  levocetirizine (XYZAL) 5 MG tablet, Take 1 tablet (5 mg total) by mouth every evening. .  montelukast (SINGULAIR) 10 MG tablet, Take 1 tablet (10 mg total) by mouth at bedtime.   Current Outpatient Medications (Hematological):  .  folic acid (FOLVITE) 357 MCG tablet, Take 400  mcg by mouth daily.  Current Outpatient Medications (Other):  [name]  Cholecalciferol (VITAMIN D3) 125 MCG (5000 UT) CAPS, Take 1 capsule (5,000 Units total) by mouth daily. [name]  ketoconazole (NIZORAL) 2 % cream, Apply 1 application topically daily. .  Melatonin 5 MG TABS, Take by mouth. .  Multiple Vitamin (MULTIVITAMIN) capsule, Take 1 capsule by mouth daily. .  Nutritional Supplements (JUICE PLUS FIBRE PO), Take by mouth. .  nystatin-triamcinolone (MYCOLOG II) cream, Apply 1 application topically 2 (two) times daily. .  Prenat w/o A-FeCbn-DSS-FA-DHA (PRENAISSANCE PLUS) 28-1-250 MG CAPS,  .  triamcinolone ointment (KENALOG) 0.5 %, Apply 1 application topically 2 (two) times daily. .  Vitamin D, Ergocalciferol, (DRISDOL) 1.25 MG (50000 UNIT) CAPS capsule, TAKE 1 CAPSULE (50,000 UNITS TOTAL) BY MOUTH EVERY 7 (SEVEN) DAYS.   Reviewed prior external information including notes and imaging from  primary care provider As well as notes that were available from care everywhere and other healthcare systems.  Past medical history, social, surgical and family history all reviewed in electronic medical record.  No pertanent information unless stated regarding to the chief complaint.   Review of Systems:  No headache, visual changes, nausea, vomiting, diarrhea, constipation, dizziness, abdominal pain, skin rash, fevers, chills, night sweats, weight loss, swollen lymph nodes, body aches, joint swelling, chest pain, shortness of breath, mood changes. POSITIVE muscle aches  Objective  Blood pressure 90/70, pulse 75, height 5' 2.5" (1.588 m), weight 151 lb (68.5 kg), SpO2 97 %.   General: No apparent distress alert and oriented x3 mood and affect normal, dressed appropriately.  HEENT: Pupils equal, extraocular movements intact  Respiratory: Patient's speak in full sentences and does not appear short of breath  Cardiovascular: No lower extremity edema, non tender, no erythema  Neuro: Cranial nerves II through  XII are intact, neurovascularly intact in all extremities with 2+ DTRs and 2+ pulses.  Gait normal with good balance and coordination.  MSK: Foot exam shows the patient has some improvement in the first toe.  A significant decrease in inflammation at the moment. Nontender on exam   Impression and Recommendations:     The above documentation has been reviewed and is accurate and complete Lyndal Pulley, DO       Note: This dictation was prepared with Dragon dictation along with smaller phrase technology. Any transcriptional errors that result from this process are unintentional.

## 2019-12-23 NOTE — Assessment & Plan Note (Signed)
Patient has been doing remarkably well at this time.  No need to change in any type of management.  Feels like patient is doing well and will follow up with 3 months or as needed

## 2020-03-16 ENCOUNTER — Ambulatory Visit: Payer: 59 | Admitting: Family Medicine

## 2020-05-04 DIAGNOSIS — Z01419 Encounter for gynecological examination (general) (routine) without abnormal findings: Secondary | ICD-10-CM | POA: Diagnosis not present

## 2020-05-04 DIAGNOSIS — Z114 Encounter for screening for human immunodeficiency virus [HIV]: Secondary | ICD-10-CM | POA: Diagnosis not present

## 2020-05-04 DIAGNOSIS — Z113 Encounter for screening for infections with a predominantly sexual mode of transmission: Secondary | ICD-10-CM | POA: Diagnosis not present

## 2020-05-04 DIAGNOSIS — N951 Menopausal and female climacteric states: Secondary | ICD-10-CM | POA: Diagnosis not present

## 2020-05-04 DIAGNOSIS — Z1159 Encounter for screening for other viral diseases: Secondary | ICD-10-CM | POA: Diagnosis not present

## 2020-09-18 ENCOUNTER — Ambulatory Visit: Payer: 59 | Admitting: Internal Medicine

## 2020-09-18 ENCOUNTER — Encounter: Payer: Self-pay | Admitting: Internal Medicine

## 2020-09-18 ENCOUNTER — Other Ambulatory Visit: Payer: Self-pay

## 2020-09-18 VITALS — BP 118/78 | HR 69 | Temp 98.6°F | Resp 18 | Ht 62.5 in | Wt 155.8 lb

## 2020-09-18 DIAGNOSIS — R109 Unspecified abdominal pain: Secondary | ICD-10-CM | POA: Insufficient documentation

## 2020-09-18 NOTE — Progress Notes (Signed)
   Subjective:   Patient ID: Shirley Wallace, female    DOB: 01-15-1971, 50 y.o.   MRN: [id number]  HPI The patient is a 50 YO female coming in for left flank pain. Started in the last month or two. Drinking water and passing gas seems to help. Less regular BM than usual every other day used to be daily. Denies worsening pain with bending or twisting. Denies pain with exertion. Overall stable. Denies taking anything for this. She is not sure but thinks she has had colonoscopy with [facility] (Dr Collene Mares and hung not sure which) in the last few years.   Review of Systems  Constitutional: Negative.   HENT: Negative.   Eyes: Negative.   Respiratory: Negative for cough, chest tightness and shortness of breath.   Cardiovascular: Negative for chest pain, palpitations and leg swelling.  Gastrointestinal: Negative for abdominal distention, abdominal pain, constipation, diarrhea, nausea and vomiting.  Genitourinary: Positive for flank pain.  Skin: Negative.   Neurological: Negative.   Psychiatric/Behavioral: Negative.     Objective:  Physical Exam Constitutional:      Appearance: She is well-developed.  HENT:     Head: Normocephalic and atraumatic.  Cardiovascular:     Rate and Rhythm: Normal rate and regular rhythm.  Pulmonary:     Effort: Pulmonary effort is normal. No respiratory distress.     Breath sounds: Normal breath sounds. No wheezing or rales.  Abdominal:     General: Bowel sounds are normal. There is no distension.     Palpations: Abdomen is soft.     Tenderness: There is no abdominal tenderness. There is no rebound.  Musculoskeletal:     Cervical back: Normal range of motion.  Skin:    General: Skin is warm and dry.  Neurological:     Mental Status: She is alert and oriented to person, place, and time.     Coordination: Coordination normal.     Vitals:   09/18/20 0909  BP: 118/78  Pulse: 69  Resp: 18  Temp: 98.6 F (37 C)  TempSrc: Oral  SpO2: 98%  Weight: 155 lb  12.8 oz (70.7 kg)  Height: 5' 2.5" (1.588 m)   EKG: Rate 67, axis normal, interval normal, sinus, no st or t wave changes, no significant change compared to prior 2013   This visit occurred during the SARS-CoV-2 public health emergency.  Safety protocols were in place, including screening questions prior to the visit, additional usage of staff PPE, and extensive cleaning of exam room while observing appropriate contact time as indicated for disinfecting solutions.   Assessment & Plan:

## 2020-09-18 NOTE — Patient Instructions (Addendum)
Your EKG looks normal so this is likely coming from gas due to mild constipation.   High-Fiber Eating Plan Fiber, also called dietary fiber, is a type of carbohydrate. It is found foods such as fruits, vegetables, whole grains, and beans. A high-fiber diet can have many health benefits. Your health care provider may recommend a high-fiber diet to help:  Prevent constipation. Fiber can make your bowel movements more regular.  Lower your cholesterol.  Relieve the following conditions: ? Inflammation of veins in the anus (hemorrhoids). ? Inflammation of specific areas of the digestive tract (uncomplicated diverticulosis). ? A problem of the large intestine, also called the colon, that sometimes causes pain and diarrhea (irritable bowel syndrome, or IBS).  Prevent overeating as part of a weight-loss plan.  Prevent heart disease, type 2 diabetes, and certain cancers. What are tips for following this plan? Reading food labels  Check the nutrition facts label on food products for the amount of dietary fiber. Choose foods that have 5 grams of fiber or more per serving.  The goals for recommended daily fiber intake include: ? Men (age 101 or younger): 34-38 g. ? Men (over age 19): 28-34 g. ? Women (age 85 or younger): 25-28 g. ? Women (over age 67): 22-25 g. Your daily fiber goal is _____________ g.   Shopping  Choose whole fruits and vegetables instead of processed forms, such as apple juice or applesauce.  Choose a wide variety of high-fiber foods such as avocados, lentils, oats, and kidney beans.  Read the nutrition facts label of the foods you choose. Be aware of foods with added fiber. These foods often have high sugar and sodium amounts per serving. Cooking  Use whole-grain flour for baking and cooking.  Cook with brown rice instead of white rice. Meal planning  Start the day with a breakfast that is high in fiber, such as a cereal that contains 5 g of fiber or more per  serving.  Eat breads and cereals that are made with whole-grain flour instead of refined flour or white flour.  Eat brown rice, bulgur wheat, or millet instead of white rice.  Use beans in place of meat in soups, salads, and pasta dishes.  Be sure that half of the grains you eat each day are whole grains. General information  You can get the recommended daily intake of dietary fiber by: ? Eating a variety of fruits, vegetables, grains, nuts, and beans. ? Taking a fiber supplement if you are not able to take in enough fiber in your diet. It is better to get fiber through food than from a supplement.  Gradually increase how much fiber you consume. If you increase your intake of dietary fiber too quickly, you may have bloating, cramping, or gas.  Drink plenty of water to help you digest fiber.  Choose high-fiber snacks, such as berries, raw vegetables, nuts, and popcorn. What foods should I eat? Fruits Berries. Pears. Apples. Oranges. Avocado. Prunes and raisins. Dried figs. Vegetables Sweet potatoes. Spinach. Kale. Artichokes. Cabbage. Broccoli. Cauliflower. Green peas. Carrots. Squash. Grains Whole-grain breads. Multigrain cereal. Oats and oatmeal. Brown rice. Barley. Bulgur wheat. [place]. Quinoa. Bran muffins. Popcorn. Rye wafer crackers. Meats and other proteins Navy beans, kidney beans, and pinto beans. Soybeans. Split peas. Lentils. Nuts and seeds. Dairy Fiber-fortified yogurt. Beverages Fiber-fortified soy milk. Fiber-fortified orange juice. Other foods Fiber bars. The items listed above may not be a complete list of recommended foods and beverages. Contact a dietitian for more information. What foods  should I avoid? Fruits Fruit juice. Cooked, strained fruit. Vegetables Fried potatoes. Canned vegetables. Well-cooked vegetables. Grains White bread. Pasta made with refined flour. White rice. Meats and other proteins Fatty cuts of meat. Fried chicken or fried  fish. Dairy Milk. Yogurt. Cream cheese. Sour cream. Fats and oils Butters. Beverages Soft drinks. Other foods Cakes and pastries. The items listed above may not be a complete list of foods and beverages to avoid. Talk with your dietitian about what choices are best for you. Summary  Fiber is a type of carbohydrate. It is found in foods such as fruits, vegetables, whole grains, and beans.  A high-fiber diet has many benefits. It can help to prevent constipation, lower blood cholesterol, aid weight loss, and reduce your risk of heart disease, diabetes, and certain cancers.  Increase your intake of fiber gradually. Increasing fiber too quickly may cause cramping, bloating, and gas. Drink plenty of water while you increase the amount of fiber you consume.  The best sources of fiber include whole fruits and vegetables, whole grains, nuts, seeds, and beans. This information is not intended to replace advice given to you by your health care provider. Make sure you discuss any questions you have with your health care provider. Document Revised: 08/22/2019 Document Reviewed: 08/22/2019 Elsevier Patient Education  2021 [employer].

## 2020-09-18 NOTE — Assessment & Plan Note (Addendum)
EKG done in office without change. Likely related to mild constipation. Will get records from GI to see if she is up to date on colon screening. Advised on adding fiber and water to help with regularity.

## 2020-10-01 DIAGNOSIS — H5203 Hypermetropia, bilateral: Secondary | ICD-10-CM | POA: Diagnosis not present

## 2020-10-05 ENCOUNTER — Other Ambulatory Visit: Payer: Self-pay | Admitting: Internal Medicine

## 2020-10-05 DIAGNOSIS — Z1231 Encounter for screening mammogram for malignant neoplasm of breast: Secondary | ICD-10-CM

## 2020-10-08 ENCOUNTER — Other Ambulatory Visit (HOSPITAL_COMMUNITY): Payer: Self-pay

## 2020-10-08 ENCOUNTER — Telehealth: Payer: Self-pay | Admitting: Internal Medicine

## 2020-10-08 NOTE — Telephone Encounter (Signed)
See below

## 2020-10-08 NOTE — Telephone Encounter (Signed)
Spoke with the patient and she stated that she is going to call her ob/gyn. No other questions or concerns.

## 2020-10-08 NOTE — Telephone Encounter (Signed)
Patient is requesting a refill on medication that regulates her menstrual cycles. She said that she could not remember the name. It can be sent to [facility]

## 2020-10-08 NOTE — Telephone Encounter (Signed)
We haven't prescribed anything like that for her since 2020. She should contact her ob/gyn as they are likely ones prescribing.

## 2020-10-14 ENCOUNTER — Other Ambulatory Visit (HOSPITAL_COMMUNITY): Payer: Self-pay

## 2020-10-16 ENCOUNTER — Other Ambulatory Visit (HOSPITAL_COMMUNITY): Payer: Self-pay

## 2020-10-16 MED ORDER — FLUCONAZOLE 150 MG PO TABS
150.0000 mg | ORAL_TABLET | Freq: Once | ORAL | 0 refills | Status: AC
  Filled 2020-10-16: qty 1, 1d supply, fill #0

## 2020-10-23 ENCOUNTER — Other Ambulatory Visit (HOSPITAL_COMMUNITY): Payer: Self-pay

## 2020-10-26 ENCOUNTER — Other Ambulatory Visit: Payer: Self-pay

## 2020-10-26 ENCOUNTER — Ambulatory Visit (HOSPITAL_COMMUNITY)
Admission: EM | Admit: 2020-10-26 | Discharge: 2020-10-26 | Disposition: A | Discharge status: Home / Self Care | Payer: 59 | Attending: Internal Medicine | Admitting: Internal Medicine

## 2020-10-26 DIAGNOSIS — R109 Unspecified abdominal pain: Secondary | ICD-10-CM | POA: Insufficient documentation

## 2020-10-26 DIAGNOSIS — Z20822 Contact with and (suspected) exposure to covid-19: Secondary | ICD-10-CM | POA: Insufficient documentation

## 2020-10-26 NOTE — ED Triage Notes (Signed)
Pt in for covid testing  Denies any sx

## 2020-10-27 LAB — SARS CORONAVIRUS 2 (TAT 6-24 HRS): SARS Coronavirus 2: NEGATIVE

## 2020-11-27 ENCOUNTER — Ambulatory Visit: Payer: 59

## 2020-12-02 ENCOUNTER — Ambulatory Visit
Admission: RE | Admit: 2020-12-02 | Discharge: 2020-12-02 | Disposition: A | Discharge status: Home / Self Care | Payer: 59 | Source: Ambulatory Visit | Attending: Internal Medicine | Admitting: Internal Medicine

## 2020-12-02 ENCOUNTER — Other Ambulatory Visit: Payer: Self-pay

## 2020-12-02 DIAGNOSIS — Z1231 Encounter for screening mammogram for malignant neoplasm of breast: Secondary | ICD-10-CM

## 2021-02-05 ENCOUNTER — Other Ambulatory Visit: Payer: Self-pay

## 2021-02-05 ENCOUNTER — Other Ambulatory Visit (HOSPITAL_COMMUNITY): Payer: Self-pay

## 2021-02-05 ENCOUNTER — Ambulatory Visit: Payer: 59 | Admitting: Internal Medicine

## 2021-02-05 ENCOUNTER — Encounter: Payer: Self-pay | Admitting: Internal Medicine

## 2021-02-05 DIAGNOSIS — R052 Subacute cough: Secondary | ICD-10-CM | POA: Diagnosis not present

## 2021-02-05 DIAGNOSIS — R059 Cough, unspecified: Secondary | ICD-10-CM | POA: Insufficient documentation

## 2021-02-05 MED ORDER — PROMETHAZINE-DM 6.25-15 MG/5ML PO SYRP
5.0000 mL | ORAL_SOLUTION | Freq: Four times a day (QID) | ORAL | 0 refills | Status: DC | PRN
  Filled 2021-02-05: qty 118, 6d supply, fill #0

## 2021-02-05 MED ORDER — CETIRIZINE HCL 10 MG PO TABS
10.0000 mg | ORAL_TABLET | Freq: Every day | ORAL | 11 refills | Status: DC
  Filled 2021-02-05: qty 30, 30d supply, fill #0

## 2021-02-05 MED ORDER — BENZONATATE 200 MG PO CAPS
200.0000 mg | ORAL_CAPSULE | Freq: Three times a day (TID) | ORAL | 0 refills | Status: DC | PRN
  Filled 2021-02-05: qty 60, 20d supply, fill #0

## 2021-02-05 NOTE — Assessment & Plan Note (Signed)
Suspect related to seasonal allergy flare and with evidence to support that on PE. She has been taking xyzal and singulair in the past and stopped due to ineffectiveness. Rx zyrtec to replace as well as tessalon perles and promethazine/dm for cough while this is improving for comfort.

## 2021-02-05 NOTE — Progress Notes (Signed)
   Subjective:   Patient ID: Shirley Wallace, female    DOB: 1970/11/26, 50 y.o.   MRN: [id number]  HPI The patient is a 50 YO female coming in for chronic cough and sinus drainage.   Review of Systems  Constitutional: Negative.   HENT:  Positive for postnasal drip and rhinorrhea.   Eyes: Negative.   Respiratory:  Positive for cough. Negative for chest tightness and shortness of breath.   Cardiovascular:  Negative for chest pain, palpitations and leg swelling.  Gastrointestinal:  Negative for abdominal distention, abdominal pain, constipation, diarrhea, nausea and vomiting.  Musculoskeletal: Negative.   Skin: Negative.   Neurological: Negative.   Psychiatric/Behavioral: Negative.     Objective:  Physical Exam Constitutional:      Appearance: She is well-developed.  HENT:     Head: Normocephalic and atraumatic.     Nose: Rhinorrhea present.     Mouth/Throat:     Pharynx: Posterior oropharyngeal erythema present.  Cardiovascular:     Rate and Rhythm: Normal rate and regular rhythm.  Pulmonary:     Effort: Pulmonary effort is normal. No respiratory distress.     Breath sounds: Normal breath sounds. No wheezing or rales.  Abdominal:     General: Bowel sounds are normal. There is no distension.     Palpations: Abdomen is soft.     Tenderness: There is no abdominal tenderness. There is no rebound.  Musculoskeletal:     Cervical back: Normal range of motion.  Skin:    General: Skin is warm and dry.  Neurological:     Mental Status: She is alert and oriented to person, place, and time.     Coordination: Coordination normal.    Vitals:   02/05/21 0917  BP: 118/78  Pulse: 64  Resp: 18  Temp: 98.3 F (36.8 C)  TempSrc: Oral  SpO2: 98%  Weight: 160 lb (72.6 kg)  Height: 5' 2.5" (1.588 m)    This visit occurred during the SARS-CoV-2 public health emergency.  Safety protocols were in place, including screening questions prior to the visit, additional usage of staff PPE, and  extensive cleaning of exam room while observing appropriate contact time as indicated for disinfecting solutions.   Assessment & Plan:

## 2021-02-05 NOTE — Patient Instructions (Signed)
We have sent in the cough syrup and the tessalon perles to use during the day.  We have sent in cetirizine for the allergies and drainage.

## 2021-04-30 ENCOUNTER — Other Ambulatory Visit (HOSPITAL_COMMUNITY): Payer: Self-pay

## 2021-04-30 NOTE — Telephone Encounter (Deleted)
Error

## 2021-05-05 DIAGNOSIS — Z1159 Encounter for screening for other viral diseases: Secondary | ICD-10-CM | POA: Diagnosis not present

## 2021-05-05 DIAGNOSIS — Z01419 Encounter for gynecological examination (general) (routine) without abnormal findings: Secondary | ICD-10-CM | POA: Diagnosis not present

## 2021-05-05 DIAGNOSIS — Z131 Encounter for screening for diabetes mellitus: Secondary | ICD-10-CM | POA: Diagnosis not present

## 2021-05-05 DIAGNOSIS — Z114 Encounter for screening for human immunodeficiency virus [HIV]: Secondary | ICD-10-CM | POA: Diagnosis not present

## 2021-05-05 DIAGNOSIS — Z13220 Encounter for screening for lipoid disorders: Secondary | ICD-10-CM | POA: Diagnosis not present

## 2021-05-05 DIAGNOSIS — Z113 Encounter for screening for infections with a predominantly sexual mode of transmission: Secondary | ICD-10-CM | POA: Diagnosis not present

## 2021-05-05 DIAGNOSIS — Z130 Encounter for screening for diseases of the blood and blood-forming organs and certain disorders involving the immune mechanism: Secondary | ICD-10-CM | POA: Diagnosis not present

## 2021-05-05 DIAGNOSIS — Z1329 Encounter for screening for other suspected endocrine disorder: Secondary | ICD-10-CM | POA: Diagnosis not present

## 2021-05-05 DIAGNOSIS — Z0000 Encounter for general adult medical examination without abnormal findings: Secondary | ICD-10-CM | POA: Diagnosis not present

## 2021-05-05 DIAGNOSIS — R87610 Atypical squamous cells of undetermined significance on cytologic smear of cervix (ASC-US): Secondary | ICD-10-CM | POA: Diagnosis not present

## 2021-07-02 ENCOUNTER — Other Ambulatory Visit: Payer: Self-pay

## 2021-07-02 ENCOUNTER — Ambulatory Visit: Payer: 59 | Admitting: Internal Medicine

## 2021-07-02 ENCOUNTER — Encounter: Payer: Self-pay | Admitting: Internal Medicine

## 2021-07-02 DIAGNOSIS — N644 Mastodynia: Secondary | ICD-10-CM | POA: Insufficient documentation

## 2021-07-02 NOTE — Telephone Encounter (Signed)
NOTE NOT NEEDED ?

## 2021-07-02 NOTE — Patient Instructions (Signed)
Give this another week or so and if gone do not worry about it. If this is not improving let us know and we can order a mammogram. ?

## 2021-07-02 NOTE — Progress Notes (Signed)
? ?  Subjective:  ? ?Patient ID: Shirley Wallace, female    DOB: 1971-01-08, 51 y.o.   MRN: [id number] ? ?HPI ?The patient is a 51 YO female coming in for right breast problem. Pain 1-2 weeks ago. Is exercising more jump roping etc.  ? ?Review of Systems  ?Constitutional: Negative.   ?HENT: Negative.    ?Eyes: Negative.   ?Respiratory:  Negative for cough, chest tightness and shortness of breath.   ?Cardiovascular:  Negative for chest pain, palpitations and leg swelling.  ?Gastrointestinal:  Negative for abdominal distention, abdominal pain, constipation, diarrhea, nausea and vomiting.  ?Genitourinary:   ?     Breast tenderness  ?Musculoskeletal: Negative.   ?Skin: Negative.   ?Neurological: Negative.   ?Psychiatric/Behavioral: Negative.    ? ?Objective:  ?Physical Exam ?Constitutional:   ?   Appearance: She is well-developed.  ?HENT:  ?   Head: Normocephalic and atraumatic.  ?Cardiovascular:  ?   Rate and Rhythm: Normal rate and regular rhythm.  ?Pulmonary:  ?   Effort: Pulmonary effort is normal. No respiratory distress.  ?   Breath sounds: Normal breath sounds. No wheezing or rales.  ?   Comments: Bilateral breast exam normal without skin changes, masses, or discharge, no LN detected ?Abdominal:  ?   General: Bowel sounds are normal. There is no distension.  ?   Palpations: Abdomen is soft.  ?   Tenderness: There is no abdominal tenderness. There is no rebound.  ?Musculoskeletal:  ?   Cervical back: Normal range of motion.  ?Skin: ?   General: Skin is warm and dry.  ?Neurological:  ?   Mental Status: She is alert and oriented to person, place, and time.  ?   Coordination: Coordination normal.  ? ? ?Vitals:  ? 07/02/21 0929  ?BP: 124/80  ?Pulse: 68  ?Resp: 18  ?SpO2: 99%  ?Weight: 162 lb (73.5 kg)  ?Height: 5' 2.5" (1.588 m)  ? ? ?This visit occurred during the SARS-CoV-2 public health emergency.  Safety protocols were in place, including screening questions prior to the visit, additional usage of staff PPE, and  extensive cleaning of exam room while observing appropriate contact time as indicated for disinfecting solutions.  ? ?Assessment & Plan:  ?Visit time 18 minutes in face to face communication with patient and coordination of care, additional 5 minutes spent in record review, coordination or care, ordering tests, communicating/referring to other healthcare professionals, documenting in medical records all on the same day of the visit for total time 23 minutes spent on the visit.  ? ?

## 2021-07-02 NOTE — Assessment & Plan Note (Signed)
Suspect due to activity. Counseled about support. Breast exam normal today. Prior mammogram 11/2020 without findings and discussed today. Can use ibuprofen for pain and if no improvement in 1 week let us know we will order mammogram diagnostic and [place] to evaluate. ?

## 2021-08-04 ENCOUNTER — Other Ambulatory Visit (HOSPITAL_COMMUNITY): Payer: Self-pay

## 2021-08-05 ENCOUNTER — Other Ambulatory Visit (HOSPITAL_COMMUNITY): Payer: Self-pay

## 2021-08-05 MED ORDER — OMRON 3 SERIES BP MONITOR DEVI
0 refills | Status: AC
  Filled 2021-08-05: qty 1, 1d supply, fill #0

## 2021-12-01 ENCOUNTER — Other Ambulatory Visit: Payer: Self-pay | Admitting: Internal Medicine

## 2021-12-01 DIAGNOSIS — Z1231 Encounter for screening mammogram for malignant neoplasm of breast: Secondary | ICD-10-CM

## 2021-12-10 ENCOUNTER — Encounter: Payer: 59 | Admitting: Internal Medicine

## 2021-12-15 ENCOUNTER — Ambulatory Visit: Payer: 59

## 2021-12-16 ENCOUNTER — Ambulatory Visit
Admission: RE | Admit: 2021-12-16 | Discharge: 2021-12-16 | Disposition: A | Discharge status: Home / Self Care | Payer: 59 | Source: Ambulatory Visit | Attending: Internal Medicine | Admitting: Internal Medicine

## 2021-12-16 DIAGNOSIS — Z1231 Encounter for screening mammogram for malignant neoplasm of breast: Secondary | ICD-10-CM | POA: Diagnosis not present

## 2021-12-17 ENCOUNTER — Encounter: Payer: Self-pay | Admitting: Internal Medicine

## 2021-12-17 ENCOUNTER — Ambulatory Visit (INDEPENDENT_AMBULATORY_CARE_PROVIDER_SITE_OTHER): Payer: 59 | Admitting: Internal Medicine

## 2021-12-17 VITALS — BP 110/80 | HR 72 | Temp 98.5°F | Ht 62.5 in | Wt 167.0 lb

## 2021-12-17 DIAGNOSIS — N951 Menopausal and female climacteric states: Secondary | ICD-10-CM

## 2021-12-17 DIAGNOSIS — J302 Other seasonal allergic rhinitis: Secondary | ICD-10-CM | POA: Diagnosis not present

## 2021-12-17 DIAGNOSIS — Z0000 Encounter for general adult medical examination without abnormal findings: Secondary | ICD-10-CM | POA: Diagnosis not present

## 2021-12-17 DIAGNOSIS — J3089 Other allergic rhinitis: Secondary | ICD-10-CM | POA: Diagnosis not present

## 2021-12-17 LAB — LIPID PANEL
Cholesterol: 218 mg/dL — ABNORMAL HIGH (ref 0–200)
HDL: 43.6 mg/dL (ref >39.00)
LDL Cholesterol: 150 mg/dL — ABNORMAL HIGH (ref 0–99)
NonHDL: 174.77
Total CHOL/HDL Ratio: 5
Triglycerides: 123 mg/dL (ref 0.0–149.0)
VLDL: 24.6 mg/dL (ref 0.0–40.0)

## 2021-12-17 LAB — TSH: TSH: 1.43 u[IU]/mL (ref 0.35–5.50)

## 2021-12-17 LAB — COMPREHENSIVE METABOLIC PANEL
ALT: 15 U/L (ref 0–35)
AST: 16 U/L (ref 0–37)
Albumin: 4.5 g/dL (ref 3.5–5.2)
Alkaline Phosphatase: 64 U/L (ref 39–117)
BUN: 11 mg/dL (ref 6–23)
CO2: 31 mEq/L (ref 19–32)
Calcium: 9.5 mg/dL (ref 8.4–10.5)
Chloride: 102 mEq/L (ref 96–112)
Creatinine, Ser: 1.01 mg/dL (ref 0.40–1.20)
GFR: 65.46 mL/min (ref >60.00)
Glucose, Bld: 93 mg/dL (ref 70–99)
Potassium: 3.9 mEq/L (ref 3.5–5.1)
Sodium: 142 mEq/L (ref 135–145)
Total Bilirubin: 0.3 mg/dL (ref 0.2–1.2)
Total Protein: 7.5 g/dL (ref 6.0–8.3)

## 2021-12-17 LAB — CBC
HCT: 37.9 % (ref 36.0–46.0)
Hemoglobin: 12.6 g/dL (ref 12.0–15.0)
MCHC: 33.1 g/dL (ref 30.0–36.0)
MCV: 81.9 fl (ref 78.0–100.0)
Platelets: 323 10*3/uL (ref 150.0–400.0)
RBC: 4.62 Mil/uL (ref 3.87–5.11)
RDW: 13.2 % (ref 11.5–15.5)
WBC: 5.4 10*3/uL (ref 4.0–10.5)

## 2021-12-17 NOTE — Progress Notes (Signed)
   Subjective:   Patient ID: Shirley Wallace, female    DOB: [birth year], 51 y.o.   MRN: [id number]  HPI The patient is here for physical.  PMH, [facility], social history reviewed and updated  Review of Systems  Constitutional: Negative.   HENT: Negative.    Eyes: Negative.   Respiratory:  Negative for cough, chest tightness and shortness of breath.   Cardiovascular:  Negative for chest pain, palpitations and leg swelling.  Gastrointestinal:  Negative for abdominal distention, abdominal pain, constipation, diarrhea, nausea and vomiting.  Musculoskeletal: Negative.   Skin: Negative.   Neurological: Negative.   Psychiatric/Behavioral: Negative.      Objective:  Physical Exam Constitutional:      Appearance: She is well-developed.  HENT:     Head: Normocephalic and atraumatic.  Cardiovascular:     Rate and Rhythm: Normal rate and regular rhythm.  Pulmonary:     Effort: Pulmonary effort is normal. No respiratory distress.     Breath sounds: Normal breath sounds. No wheezing or rales.  Abdominal:     General: Bowel sounds are normal. There is no distension.     Palpations: Abdomen is soft.     Tenderness: There is no abdominal tenderness. There is no rebound.  Musculoskeletal:     Cervical back: Normal range of motion.  Skin:    General: Skin is warm and dry.  Neurological:     Mental Status: She is alert and oriented to person, place, and time.     Coordination: Coordination normal.     Vitals:   12/17/21 1050  BP: 110/80  Pulse: 72  Temp: 98.5 F (36.9 C)  TempSrc: Oral  SpO2: 97%  Weight: 167 lb (75.8 kg)  Height: 5' 2.5" (1.588 m)    Assessment & Plan:

## 2021-12-17 NOTE — Assessment & Plan Note (Signed)
Flu shot yearly. Covid-19 counseled. Tetanus up to date. Colonoscopy had about 2 years ago getting records. Mammogram up to date with gyn, pap smear up to date with gyn. Counseled about sun safety and mole surveillance. Counseled about the dangers of distracted driving. Given 10 year screening recommendations.

## 2021-12-17 NOTE — Assessment & Plan Note (Signed)
Taking low dose OCP and well controlled.

## 2021-12-17 NOTE — Assessment & Plan Note (Signed)
Uses zyrtec and otc with decent relief.

## 2021-12-17 NOTE — Addendum Note (Signed)
Addended by: Pricilla Holm A on: 12/17/2021 11:19 AM   Modules accepted: Orders

## 2021-12-20 ENCOUNTER — Other Ambulatory Visit: Payer: Self-pay | Admitting: Internal Medicine

## 2021-12-20 DIAGNOSIS — R928 Other abnormal and inconclusive findings on diagnostic imaging of breast: Secondary | ICD-10-CM

## 2021-12-29 ENCOUNTER — Other Ambulatory Visit: Payer: 59

## 2022-01-06 ENCOUNTER — Ambulatory Visit: Payer: 59

## 2022-01-06 ENCOUNTER — Ambulatory Visit
Admission: RE | Admit: 2022-01-06 | Discharge: 2022-01-06 | Disposition: A | Discharge status: Home / Self Care | Payer: 59 | Source: Ambulatory Visit | Attending: Internal Medicine | Admitting: Internal Medicine

## 2022-01-06 DIAGNOSIS — R928 Other abnormal and inconclusive findings on diagnostic imaging of breast: Secondary | ICD-10-CM | POA: Diagnosis not present

## 2022-01-28 DIAGNOSIS — H5203 Hypermetropia, bilateral: Secondary | ICD-10-CM | POA: Diagnosis not present

## 2022-04-01 ENCOUNTER — Other Ambulatory Visit (HOSPITAL_COMMUNITY): Payer: Self-pay

## 2022-04-01 MED ORDER — OMRON 3 SERIES BP MONITOR DEVI
0 refills | Status: AC
  Filled 2022-04-01: qty 1, 1d supply, fill #0

## 2022-04-26 ENCOUNTER — Other Ambulatory Visit (HOSPITAL_COMMUNITY): Payer: Self-pay

## 2022-04-26 ENCOUNTER — Ambulatory Visit: Payer: 59 | Admitting: Family Medicine

## 2022-04-26 ENCOUNTER — Encounter: Payer: Self-pay | Admitting: Family Medicine

## 2022-04-26 VITALS — BP 108/76 | HR 90 | Temp 97.7°F | Ht 62.5 in | Wt 161.0 lb

## 2022-04-26 DIAGNOSIS — J069 Acute upper respiratory infection, unspecified: Secondary | ICD-10-CM

## 2022-04-26 DIAGNOSIS — R051 Acute cough: Secondary | ICD-10-CM

## 2022-04-26 DIAGNOSIS — J3089 Other allergic rhinitis: Secondary | ICD-10-CM

## 2022-04-26 DIAGNOSIS — J302 Other seasonal allergic rhinitis: Secondary | ICD-10-CM | POA: Diagnosis not present

## 2022-04-26 DIAGNOSIS — J029 Acute pharyngitis, unspecified: Secondary | ICD-10-CM

## 2022-04-26 LAB — POCT INFLUENZA A/B
Influenza A, POC: NEGATIVE
Influenza B, POC: NEGATIVE

## 2022-04-26 LAB — POC COVID19 BINAXNOW: SARS Coronavirus 2 Ag: NEGATIVE

## 2022-04-26 MED ORDER — BENZONATATE 200 MG PO CAPS
200.0000 mg | ORAL_CAPSULE | Freq: Three times a day (TID) | ORAL | 0 refills | Status: DC | PRN
  Filled 2022-04-26: qty 60, 20d supply, fill #0

## 2022-04-26 NOTE — Progress Notes (Signed)
Subjective:  Shirley Wallace is a 51 y.o. female who presents for a 24-hour history of sore throat, intermittent nasal congestion and headache.  States she has had a dry cough for approximately 1 week.  She has underlying allergies and takes Zyrtec.  Denies fever, chills, body aches, ear pain, rhinorrhea, chest pain, palpitations, shortness of breath, abdominal pain, nausea, vomiting or diarrhea.  Treatment to date: Zyrtec and Theraflu.  Denies sick contacts.  No other aggravating or relieving factors.  No other c/o.  ROS as in subjective.   Objective: Vitals:   04/26/22 1155  BP: 108/76  Pulse: 90  Temp: 97.7 F (36.5 C)  SpO2: 98%    General appearance: Alert, WD/WN, no distress, mildly ill appearing                             Skin: warm, no rash                                              Eyes: conjunctiva normal, corneas clear, PERRLA                                                 Nose: septum midline, turbinates swollen, with erythema              Mouth/throat: MMM, tongue normal, mild pharyngeal erythema                           Neck: supple, no adenopathy                          Heart: RRR                         Lungs: CTA bilaterally, no wheezes, rales, or rhonchi      Assessment: Acute URI  Acute cough - Plan: POC COVID-19, POCT Influenza A/B  Sore throat - Plan: POC COVID-19, POCT Influenza A/B  Seasonal and perennial allergic rhinitis   Plan: COVID test negative.  Flu test negative. She is not in any acute distress. Discussed diagnosis and treatment of URI.   Suggested symptomatic OTC remedies.  Recommend salt water gargles, throat lozenges and good hydration.  Also recommend Mucinex and Flonase nasal spray. Nasal saline spray for congestion.  Tylenol or Ibuprofen OTC for fever and malaise.  Call/return if worsening or not back to baseline in 7 to 10 days.

## 2022-04-26 NOTE — Patient Instructions (Signed)
Your COVID and flu tests are negative today.  You appear to have a viral illness.  I recommend treating your symptoms aggressively.  Stay well-hydrated.  Take Tylenol or ibuprofen for headache and sore throat.  Use salt water gargles and throat lozenges or cough drops as needed.  Continue taking the Zyrtec and over-the-counter cough and cold medication as needed  Follow-up here if you are getting worse or if you are not improving over the next 7 to 10 days.

## 2022-04-28 ENCOUNTER — Telehealth: Payer: Self-pay | Admitting: Internal Medicine

## 2022-04-28 ENCOUNTER — Other Ambulatory Visit (HOSPITAL_COMMUNITY): Payer: Self-pay

## 2022-04-28 ENCOUNTER — Other Ambulatory Visit: Payer: Self-pay | Admitting: Family Medicine

## 2022-04-28 MED ORDER — AZITHROMYCIN 250 MG PO TABS
ORAL_TABLET | ORAL | 0 refills | Status: AC
  Filled 2022-04-28: qty 6, 5d supply, fill #0

## 2022-04-28 NOTE — Telephone Encounter (Signed)
Patient called and said that her cough has not gotten any better since her visit 2 days ago, she would like to know if Dr. Arma Heading can call in something else.  Pharmacy:  [facility]  Number of Patient:  [phone]

## 2022-04-29 ENCOUNTER — Other Ambulatory Visit (HOSPITAL_COMMUNITY): Payer: Self-pay

## 2022-04-29 NOTE — Telephone Encounter (Signed)
Left voicemail for patient that her medication has been sent in to her pharmacy

## 2022-05-11 NOTE — Progress Notes (Unsigned)
    Subjective:    Patient ID: Shirley Wallace, female    DOB: 03-Nov-1970, 52 y.o.   MRN: [id number]      HPI Shirley Wallace is here for No chief complaint on file.   She is here for an acute visit for cold symptoms.   Her symptoms started   She is experiencing   She has tried taking       Medications and allergies reviewed with patient and updated if appropriate.  Current Outpatient Medications on File Prior to Visit  Medication Sig Dispense Refill   benzonatate (TESSALON) 200 MG capsule Take 1 capsule (200 mg total) by mouth 3 (three) times daily as needed. 60 capsule 0   Blood Pressure Monitoring (OMRON 3 SERIES BP MONITOR) DEVI Use as directed 1 each 0   Blood Pressure Monitoring (OMRON 3 SERIES BP MONITOR) DEVI Use as directed 1 each 0   cetirizine (ZYRTEC) 10 MG tablet Take 1 tablet (10 mg total) by mouth daily. 30 tablet 11   Cholecalciferol (VITAMIN D3) 125 MCG (5000 UT) CAPS Take 1 capsule (5,000 Units total) by mouth daily. (Patient not taking: Reported on 04/26/2022) 90 capsule 1   diphenhydrAMINE (BENADRYL) 25 MG tablet Take 25 mg by mouth every 6 (six) hours as needed.     folic acid (FOLVITE) 540 MCG tablet Take 400 mcg by mouth daily. (Patient not taking: Reported on 04/26/2022)     ketoconazole (NIZORAL) 2 % cream Apply 1 application topically daily. 15 g 1   Melatonin 5 MG TABS Take by mouth.     Multiple Vitamin (MULTIVITAMIN) capsule Take 1 capsule by mouth daily.     Norgestimate-Ethinyl Estradiol Triphasic (ORTHO TRI-CYCLEN LO) 0.18/0.215/0.25 MG-25 MCG tab Take 1 tablet by mouth daily. 3 Package 4   Nutritional Supplements (JUICE PLUS FIBRE PO) Take by mouth.     nystatin-triamcinolone (MYCOLOG II) cream Apply 1 application topically 2 (two) times daily. (Patient not taking: Reported on 04/26/2022) 100 g 0   Prenat w/o A-FeCbn-DSS-FA-DHA (PRENAISSANCE PLUS) 28-1-250 MG CAPS   1   triamcinolone ointment (KENALOG) 0.5 % Apply 1 application topically 2 (two) times  daily. 30 g 1   No current facility-administered medications on file prior to visit.    Review of Systems     Objective:  There were no vitals filed for this visit. BP Readings from Last 3 Encounters:  04/26/22 108/76  12/17/21 110/80  07/02/21 124/80   Wt Readings from Last 3 Encounters:  04/26/22 161 lb (73 kg)  12/17/21 167 lb (75.8 kg)  07/02/21 162 lb (73.5 kg)   There is no height or weight on file to calculate BMI.    Physical Exam         Assessment & Plan:    See Problem List for Assessment and Plan of chronic medical problems.

## 2022-05-12 ENCOUNTER — Encounter: Payer: Self-pay | Admitting: Internal Medicine

## 2022-05-12 ENCOUNTER — Other Ambulatory Visit (HOSPITAL_COMMUNITY): Payer: Self-pay

## 2022-05-12 ENCOUNTER — Ambulatory Visit: Payer: 59 | Admitting: Internal Medicine

## 2022-05-12 VITALS — BP 112/74 | HR 80 | Temp 98.6°F | Ht 62.5 in | Wt 159.6 lb

## 2022-05-12 DIAGNOSIS — J069 Acute upper respiratory infection, unspecified: Secondary | ICD-10-CM

## 2022-05-12 MED ORDER — HYDROCOD POLI-CHLORPHE POLI ER 10-8 MG/5ML PO SUER
5.0000 mL | Freq: Two times a day (BID) | ORAL | 0 refills | Status: DC | PRN
  Filled 2022-05-12: qty 115, 12d supply, fill #0

## 2022-05-12 MED ORDER — AMOXICILLIN-POT CLAVULANATE 875-125 MG PO TABS
1.0000 | ORAL_TABLET | Freq: Two times a day (BID) | ORAL | 0 refills | Status: DC
  Filled 2022-05-12: qty 20, 10d supply, fill #0

## 2022-05-12 NOTE — Patient Instructions (Addendum)
        Medications changes include :   augmentin, tussionex cough syrup      Return if symptoms worsen or fail to improve.

## 2022-05-25 ENCOUNTER — Other Ambulatory Visit (HOSPITAL_COMMUNITY): Payer: Self-pay
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
## 2022-06-27 ENCOUNTER — Ambulatory Visit: Payer: Self-pay

## 2022-06-27 ENCOUNTER — Ambulatory Visit (INDEPENDENT_AMBULATORY_CARE_PROVIDER_SITE_OTHER): Payer: 59 | Admitting: Family Medicine

## 2022-06-27 ENCOUNTER — Encounter: Payer: Self-pay | Admitting: Family Medicine

## 2022-06-27 VITALS — BP 100/72 | Ht 62.5 in | Wt 161.0 lb

## 2022-06-27 DIAGNOSIS — M25562 Pain in left knee: Secondary | ICD-10-CM

## 2022-06-27 DIAGNOSIS — M1712 Unilateral primary osteoarthritis, left knee: Secondary | ICD-10-CM | POA: Diagnosis not present

## 2022-06-27 NOTE — Assessment & Plan Note (Signed)
Patient given injection and tolerated the procedure well, discussed icing regimen and home exercises, discussed which activities to do and which ones to avoid, increase activity slowly over the course of next several weeks.  Discussed with patient about the possibility of bracing but at the moment we will hold on it.  We discussed with patient that x-rays may be necessary if this continues.  Follow-up with me again in 6 to 8 weeks.

## 2022-06-27 NOTE — Patient Instructions (Signed)
Injected knee Exercises  See me in 6-8 weeks

## 2022-07-14 ENCOUNTER — Ambulatory Visit: Payer: 59 | Admitting: Family Medicine

## 2022-08-15 NOTE — Progress Notes (Deleted)
[name] [address] [place] [address] Phone: [phone] Subjective:    I'm seeing this patient by the request  of:  [name], MD  CC:   [id number]  06/27/2022 Patient given injection and tolerated the procedure well, discussed icing regimen and home exercises, discussed which activities to do and which ones to avoid, increase activity slowly over the course of next several weeks.  Discussed with patient about the possibility of bracing but at the moment we will hold on it.  We discussed with patient that x-rays may be necessary if this continues.  Follow-up with me again in 6 to 8 weeks.      Update 08/16/2022 Shirley [facility] is a 52 y.o. female coming in with complaint of L knee pain. Patient states      Past Medical History:  Diagnosis Date   Allergy    Arthritis    Chronic headaches    Past Surgical History:  Procedure Laterality Date   CESAREAN SECTION     MYOMECTOMY     Social History   Socioeconomic History   Marital status: Married    Spouse name: Not on file   Number of children: 2   Years of education: Not on file   Highest education level: Not on file  Occupational History   Occupation: lab tech  Tobacco Use   Smoking status: Never   Smokeless tobacco: Never  Substance and Sexual Activity   Alcohol use: Yes    Alcohol/week: 2.0 standard drinks of alcohol    Types: 2 Shots of liquor per week    Comment: occasionally   Drug use: No   Sexual activity: Yes  Other Topics Concern   Not on file  Social History Narrative   Not on file   Social Determinants of Health   Financial Resource Strain: Not on file  Food Insecurity: Not on file  Transportation Needs: Not on file  Physical Activity: Not on file  Stress: Not on file  Social Connections: Not on file   Allergies  Allergen Reactions   Aspirin Swelling    Eye swelling   Family History  Problem Relation Age of Onset   Arthritis Mother     Allergies Father    Allergies Paternal Grandfather     Current Outpatient Medications (Endocrine & Metabolic):    Norgestimate-Ethinyl Estradiol Triphasic (ORTHO TRI-CYCLEN LO) 0.18/0.215/0.25 MG-25 MCG tab, Take 1 tablet by mouth daily.   Current Outpatient Medications (Respiratory):    benzonatate (TESSALON) 200 MG capsule, Take 1 capsule (200 mg total) by mouth 3 (three) times daily as needed.   cetirizine (ZYRTEC) 10 MG tablet, Take 1 tablet (10 mg total) by mouth daily.   chlorpheniramine-HYDROcodone (TUSSIONEX) 10-8 MG/5ML, Take 5 mLs by mouth every 12 (twelve) hours as needed.   diphenhydrAMINE (BENADRYL) 25 MG tablet, Take 25 mg by mouth every 6 (six) hours as needed.   Current Outpatient Medications (Hematological):    folic acid (FOLVITE) 400 MCG tablet, Take 400 mcg by mouth daily.  Current Outpatient Medications (Other):    amoxicillin-clavulanate (AUGMENTIN) 875-125 MG tablet, Take 1 tablet by mouth 2 (two) times daily.   Blood Pressure Monitoring (OMRON 3 SERIES BP MONITOR) DEVI, Use as directed   Blood Pressure Monitoring (OMRON 3 SERIES BP MONITOR) DEVI, Use as directed   Cholecalciferol (VITAMIN D3) 125 MCG (5000 UT) CAPS, Take 1 capsule (5,000 Units total) by mouth daily.   ketoconazole (NIZORAL) 2 % cream, Apply  1 application topically daily.   Melatonin 5 MG TABS, Take by mouth.   Multiple Vitamin (MULTIVITAMIN) capsule, Take 1 capsule by mouth daily.   Nutritional Supplements (JUICE PLUS FIBRE PO), Take by mouth.   nystatin-triamcinolone (MYCOLOG II) cream, Apply 1 application topically 2 (two) times daily.   Prenat w/o A-FeCbn-DSS-FA-DHA (PRENAISSANCE PLUS) 28-1-250 MG CAPS,    triamcinolone ointment (KENALOG) 0.5 %, Apply 1 application topically 2 (two) times daily.   Reviewed prior external information including notes and imaging from  primary care provider As well as notes that were available from care everywhere and other healthcare systems.  Past  medical history, social, surgical and family history all reviewed in electronic medical record.  No pertanent information unless stated regarding to the chief complaint.   Review of Systems:  No headache, visual changes, nausea, vomiting, diarrhea, constipation, dizziness, abdominal pain, skin rash, fevers, chills, night sweats, weight loss, swollen lymph nodes, body aches, joint swelling, chest pain, shortness of breath, mood changes. POSITIVE muscle aches  Objective  There were no vitals taken for this visit.   General: No apparent distress alert and oriented x3 mood and affect normal, dressed appropriately.  HEENT: Pupils equal, extraocular movements intact  Respiratory: Patient's speak in full sentences and does not appear short of breath  Cardiovascular: No lower extremity edema, non tender, no erythema      Impression and Recommendations:

## 2022-08-17 ENCOUNTER — Ambulatory Visit: Payer: 59 | Admitting: Family Medicine

## 2022-12-27 ENCOUNTER — Encounter: Payer: 59 | Admitting: Internal Medicine

## 2023-01-17 ENCOUNTER — Encounter: Payer: 59 | Admitting: Internal Medicine

## 2023-01-24 ENCOUNTER — Ambulatory Visit: Payer: 59 | Admitting: Internal Medicine

## 2023-01-24 ENCOUNTER — Encounter: Payer: Self-pay | Admitting: Internal Medicine

## 2023-01-24 ENCOUNTER — Other Ambulatory Visit (HOSPITAL_COMMUNITY): Payer: Self-pay

## 2023-01-24 ENCOUNTER — Other Ambulatory Visit: Payer: Self-pay | Admitting: Internal Medicine

## 2023-01-24 VITALS — BP 124/82 | HR 66 | Temp 98.5°F | Ht 62.5 in | Wt 160.0 lb

## 2023-01-24 DIAGNOSIS — M25512 Pain in left shoulder: Secondary | ICD-10-CM

## 2023-01-24 DIAGNOSIS — Z23 Encounter for immunization: Secondary | ICD-10-CM

## 2023-01-24 DIAGNOSIS — Z1231 Encounter for screening mammogram for malignant neoplasm of breast: Secondary | ICD-10-CM

## 2023-01-24 DIAGNOSIS — Z0000 Encounter for general adult medical examination without abnormal findings: Secondary | ICD-10-CM

## 2023-01-24 DIAGNOSIS — N951 Menopausal and female climacteric states: Secondary | ICD-10-CM | POA: Diagnosis not present

## 2023-01-24 DIAGNOSIS — G8929 Other chronic pain: Secondary | ICD-10-CM | POA: Diagnosis not present

## 2023-01-24 LAB — COMPREHENSIVE METABOLIC PANEL
ALT: 12 U/L (ref 0–35)
AST: 16 U/L (ref 0–37)
Albumin: 4.2 g/dL (ref 3.5–5.2)
Alkaline Phosphatase: 63 U/L (ref 39–117)
BUN: 10 mg/dL (ref 6–23)
CO2: 33 mEq/L — ABNORMAL HIGH (ref 19–32)
Calcium: 9.8 mg/dL (ref 8.4–10.5)
Chloride: 102 mEq/L (ref 96–112)
Creatinine, Ser: 0.87 mg/dL (ref 0.40–1.20)
GFR: 77.7 mL/min (ref >60.00)
Glucose, Bld: 86 mg/dL (ref 70–99)
Potassium: 3.8 mEq/L (ref 3.5–5.1)
Sodium: 141 mEq/L (ref 135–145)
Total Bilirubin: 0.4 mg/dL (ref 0.2–1.2)
Total Protein: 7.5 g/dL (ref 6.0–8.3)

## 2023-01-24 LAB — LIPID PANEL
Cholesterol: 208 mg/dL — ABNORMAL HIGH (ref 0–200)
HDL: 46.6 mg/dL (ref >39.00)
LDL Cholesterol: 134 mg/dL — ABNORMAL HIGH (ref 0–99)
NonHDL: 161.76
Total CHOL/HDL Ratio: 4
Triglycerides: 140 mg/dL (ref 0.0–149.0)
VLDL: 28 mg/dL (ref 0.0–40.0)

## 2023-01-24 LAB — CBC
HCT: 38.6 % (ref 36.0–46.0)
Hemoglobin: 12.6 g/dL (ref 12.0–15.0)
MCHC: 32.7 g/dL (ref 30.0–36.0)
MCV: 81.8 fl (ref 78.0–100.0)
Platelets: 341 10*3/uL (ref 150.0–400.0)
RBC: 4.72 Mil/uL (ref 3.87–5.11)
RDW: 13 % (ref 11.5–15.5)
WBC: 6.4 10*3/uL (ref 4.0–10.5)

## 2023-01-24 LAB — TSH: TSH: 1.62 u[IU]/mL (ref 0.35–5.50)

## 2023-01-24 MED ORDER — NORGESTIM-ETH ESTRAD TRIPHASIC 0.18/0.215/0.25 MG-25 MCG PO TABS
1.0000 | ORAL_TABLET | Freq: Every day | ORAL | 3 refills | Status: AC
  Filled 2023-01-24: qty 28, 28d supply, fill #0

## 2023-01-24 MED ORDER — NYSTATIN-TRIAMCINOLONE 100000-0.1 UNIT/GM-% EX CREA
1.0000 | TOPICAL_CREAM | Freq: Two times a day (BID) | CUTANEOUS | 0 refills | Status: AC
  Filled 2023-01-24: qty 30, 30d supply, fill #0

## 2023-01-24 MED ORDER — VITAMIN D3 125 MCG (5000 UT) PO CAPS
1.0000 | ORAL_CAPSULE | Freq: Every day | ORAL | 1 refills | Status: AC
  Filled 2023-01-24: qty 100, 100d supply, fill #0

## 2023-01-24 MED ORDER — CETIRIZINE HCL 10 MG PO TABS
10.0000 mg | ORAL_TABLET | Freq: Every day | ORAL | 3 refills | Status: AC
  Filled 2023-01-24: qty 100, 100d supply, fill #0

## 2023-01-24 NOTE — Progress Notes (Unsigned)
Subjective:   Patient ID: [facility], female    DOB: 12-28-1970, 52 y.o.   MRN: [id number]  HPI The patient is here for physical. With concerns about left shoulder pain 6 months  PMH, [facility], social history reviewed and updated  Review of Systems  Constitutional: Negative.   HENT: Negative.    Eyes: Negative.   Respiratory:  Negative for cough, chest tightness and shortness of breath.   Cardiovascular:  Negative for chest pain, palpitations and leg swelling.  Gastrointestinal:  Negative for abdominal distention, abdominal pain, constipation, diarrhea, nausea and vomiting.  Musculoskeletal:  Positive for arthralgias.  Skin: Negative.   Neurological: Negative.   Psychiatric/Behavioral: Negative.      Objective:  Physical Exam Constitutional:      Appearance: She is well-developed.  HENT:     Head: Normocephalic and atraumatic.  Cardiovascular:     Rate and Rhythm: Normal rate and regular rhythm.  Pulmonary:     Effort: Pulmonary effort is normal. No respiratory distress.     Breath sounds: Normal breath sounds. No wheezing or rales.  Abdominal:     General: Bowel sounds are normal. There is no distension.     Palpations: Abdomen is soft.     Tenderness: There is no abdominal tenderness. There is no rebound.  Musculoskeletal:        General: Tenderness present.     Cervical back: Normal range of motion.  Skin:    General: Skin is warm and dry.  Neurological:     Mental Status: She is alert and oriented to person, place, and time.     Coordination: Coordination normal.     Vitals:   01/24/23 1500  BP: 124/82  Pulse: 66  Temp: 98.5 F (36.9 C)  TempSrc: Oral  SpO2: 98%  Weight: 160 lb (72.6 kg)  Height: 5' 2.5" (1.588 m)    Assessment & Plan:  Flu shot given at visit

## 2023-01-24 NOTE — Patient Instructions (Signed)
Think about the shingles vaccine.

## 2023-01-25 DIAGNOSIS — M25512 Pain in left shoulder: Secondary | ICD-10-CM | POA: Insufficient documentation

## 2023-01-25 NOTE — Assessment & Plan Note (Signed)
Going on for 6 months. Given exercises and if not helpful she will call sports medicine for injection.

## 2023-01-25 NOTE — Assessment & Plan Note (Signed)
Still having periods although some missed cycles. Still taking ocps and will reassess next year if still needed. Counseled about menopause symptoms.

## 2023-01-25 NOTE — Assessment & Plan Note (Signed)
Flu shot given. Shingrix counseled declines today. Tetanus up to date. Colonoscopy up to date. Mammogram up to date, pap smear up to date with gyn. Counseled about sun safety and mole surveillance. Counseled about the dangers of distracted driving. Given 10 year screening recommendations.

## 2023-02-02 ENCOUNTER — Other Ambulatory Visit (HOSPITAL_COMMUNITY): Payer: Self-pay

## 2023-02-09 ENCOUNTER — Ambulatory Visit
Admission: RE | Admit: 2023-02-09 | Discharge: 2023-02-09 | Disposition: A | Discharge status: Home / Self Care | Payer: 59 | Source: Ambulatory Visit | Attending: Internal Medicine | Admitting: Internal Medicine

## 2023-02-09 DIAGNOSIS — Z1231 Encounter for screening mammogram for malignant neoplasm of breast: Secondary | ICD-10-CM

## 2023-02-14 LAB — HM MAMMOGRAPHY

## 2023-02-15 ENCOUNTER — Encounter: Payer: Self-pay | Admitting: Internal Medicine

## 2023-05-04 ENCOUNTER — Telehealth: Payer: Self-pay

## 2023-05-04 NOTE — Telephone Encounter (Signed)
 PATIENT IS RETURNING A CALL FROM THE OFFICE

## 2023-05-04 NOTE — Telephone Encounter (Signed)
Called and  Lvm.

## 2023-05-04 NOTE — Telephone Encounter (Signed)
 Copied from CRM [id number]. Topic: Clinical - Medication Question >> May 04, 2023 12:35 PM Denese Killings wrote: Reason for CRM: Patient is requesting a callback from nurse to see if doctor can prescribe her a medication for diarrhea.

## 2023-05-05 ENCOUNTER — Encounter: Payer: Self-pay | Admitting: Nurse Practitioner

## 2023-05-05 NOTE — Telephone Encounter (Signed)
 Pt states that she is having abdominal pain, and very loose stool. This has started since yesterday. It happens after eating food. Please advise as MD is out of office

## 2023-05-31 ENCOUNTER — Ambulatory Visit: Payer: 59 | Admitting: Family Medicine

## 2023-08-01 ENCOUNTER — Encounter: Payer: Self-pay | Admitting: Family Medicine

## 2023-08-01 ENCOUNTER — Ambulatory Visit: Admitting: Family Medicine

## 2023-08-01 ENCOUNTER — Other Ambulatory Visit: Payer: Self-pay

## 2023-08-01 VITALS — BP 122/84 | HR 74 | Ht 62.5 in | Wt 165.0 lb

## 2023-08-01 DIAGNOSIS — S90851A Superficial foreign body, right foot, initial encounter: Secondary | ICD-10-CM | POA: Diagnosis not present

## 2023-08-01 DIAGNOSIS — L089 Local infection of the skin and subcutaneous tissue, unspecified: Secondary | ICD-10-CM | POA: Diagnosis not present

## 2023-08-01 DIAGNOSIS — M79671 Pain in right foot: Secondary | ICD-10-CM | POA: Diagnosis not present

## 2023-08-01 NOTE — Progress Notes (Signed)
 [name] [address] [place] [address] Phone: [phone] Subjective:   Bruce Donath, am serving as a scribe for Dr. Antoine Primas.  I'm seeing this patient by the request  of:  [name], MD  CC: Right foot pain  [id number]  Shirley Wallace is a 53 y.o. female coming in with complaint of R foot pain. Patient last seen in February 2024 for L knee pain. Patient states that 2 weeks ago she woke up with pain at base of the 5th that wraps around the heel. Pain present regardless of shoe wear. Pain is sharp in nature. No history of foot issues.        Past Medical History:  Diagnosis Date   Allergy    Arthritis    Chronic headaches    Past Surgical History:  Procedure Laterality Date   CESAREAN SECTION     MYOMECTOMY     Social History   Socioeconomic History   Marital status: Married    Spouse name: Not on file   Number of children: 2   Years of education: Not on file   Highest education level: Not on file  Occupational History   Occupation: lab tech  Tobacco Use   Smoking status: Never   Smokeless tobacco: Never  Substance and Sexual Activity   Alcohol use: Yes    Alcohol/week: 2.0 standard drinks of alcohol    Types: 2 Shots of liquor per week    Comment: occasionally   Drug use: No   Sexual activity: Yes  Other Topics Concern   Not on file  Social History Narrative   Not on file   Social Drivers of Health   Financial Resource Strain: Not on file  Food Insecurity: Not on file  Transportation Needs: Not on file  Physical Activity: Not on file  Stress: Not on file  Social Connections: Not on file   Allergies  Allergen Reactions   Aspirin Swelling    Eye swelling   Family History  Problem Relation Age of Onset   Arthritis Mother    Allergies Father    Allergies Paternal Grandfather     Current Outpatient Medications (Endocrine & Metabolic):    Norgestimate-Ethinyl Estradiol Triphasic  (ORTHO TRI-CYCLEN LO) 0.18/0.215/0.25 MG-25 MCG tab, Take 1 tablet by mouth daily.   Current Outpatient Medications (Respiratory):    cetirizine (ZYRTEC) 10 MG tablet, Take 1 tablet (10 mg total) by mouth daily.   diphenhydrAMINE (BENADRYL) 25 MG tablet, Take 25 mg by mouth every 6 (six) hours as needed.   Current Outpatient Medications (Hematological):    folic acid (FOLVITE) 400 MCG tablet, Take 400 mcg by mouth daily.  Current Outpatient Medications (Other):    Blood Pressure Monitoring (OMRON 3 SERIES BP MONITOR) DEVI, Use as directed   Blood Pressure Monitoring (OMRON 3 SERIES BP MONITOR) DEVI, Use as directed   Cholecalciferol (VITAMIN D3) 125 MCG (5000 UT) CAPS, Take 1 capsule (5,000 Units total) by mouth daily.   ketoconazole (NIZORAL) 2 % cream, Apply 1 application topically daily.   Melatonin 5 MG TABS, Take by mouth.   Multiple Vitamin (MULTIVITAMIN) capsule, Take 1 capsule by mouth daily.   Nutritional Supplements (JUICE PLUS FIBRE PO), Take by mouth.   nystatin-triamcinolone (MYCOLOG II) cream, Apply 1 Application topically 2 (two) times daily.   Prenat w/o A-FeCbn-DSS-FA-DHA (PRENAISSANCE PLUS) 28-1-250 MG CAPS,    triamcinolone ointment (KENALOG) 0.5 %, Apply 1 application topically 2 (two) times daily.  Reviewed prior external information including notes and imaging from  primary care provider As well as notes that were available from care everywhere and other healthcare systems.  Past medical history, social, surgical and family history all reviewed in electronic medical record.  No pertanent information unless stated regarding to the chief complaint.   Review of Systems:  No headache, visual changes, nausea, vomiting, diarrhea, constipation, dizziness, abdominal pain, skin rash, fevers, chills, night sweats, weight loss, swollen lymph nodes, body aches, joint swelling, chest pain, shortness of breath, mood changes. POSITIVE muscle aches  Objective  Blood pressure  122/84, pulse 74, height 5' 2.5" (1.588 m), weight 165 lb (74.8 kg), SpO2 98%.   General: No apparent distress alert and oriented x3 mood and affect normal, dressed appropriately.  HEENT: Pupils equal, extraocular movements intact  Respiratory: Patient's speak in full sentences and does not appear short of breath  Cardiovascular: No lower extremity edema, non tender, no erythema  Foot exam shows on the lateral aspect on the plantar aspect of the foot.  Tenderness that does seem to be adequate function.  Does have an area that does look like there could have been potentially small puncture noted.   Limited muscular skeletal ultrasound was performed and interpreted by Antoine Primas, M  Limited ultrasound shows that there is hypoechoic changes noted.  Does have hyperechoic changes that is consistent with the potential density it would significant first.  Subcutaneous tissue approximately 0.4 cm deep no true infectious etiology noted. Impression: Foreign body of the foot   Impression and Recommendations:    The above documentation has been reviewed and is accurate and complete Judi Saa, DO

## 2023-08-01 NOTE — Patient Instructions (Addendum)
 Wart remover cream daily for 1 week Avoid being barefoot Write [place] in 12 days See me as needed

## 2023-08-01 NOTE — Assessment & Plan Note (Signed)
 Actually no sign of any type of infectious etiology but I do think it is more of a splinter.  Discussed with patient about icing regimen and home exercises, discussed which activities to do and which ones to avoid.  Increase activity slowly.  Follow-up again in 6 to 8 weeks.  Discussed over-the-counter topical medicine that would be helpful.

## 2023-09-01 ENCOUNTER — Other Ambulatory Visit (HOSPITAL_COMMUNITY): Payer: Self-pay

## 2023-10-13 ENCOUNTER — Other Ambulatory Visit: Payer: Self-pay | Admitting: Internal Medicine

## 2023-10-13 DIAGNOSIS — Z1231 Encounter for screening mammogram for malignant neoplasm of breast: Secondary | ICD-10-CM

## 2024-01-26 ENCOUNTER — Ambulatory Visit: Admitting: Internal Medicine

## 2024-01-26 ENCOUNTER — Encounter: Payer: Self-pay | Admitting: Internal Medicine

## 2024-01-26 VITALS — BP 126/88 | HR 70 | Temp 98.2°F | Ht 62.5 in | Wt 171.0 lb

## 2024-01-26 DIAGNOSIS — E66811 Obesity, class 1: Secondary | ICD-10-CM

## 2024-01-26 DIAGNOSIS — E669 Obesity, unspecified: Secondary | ICD-10-CM | POA: Insufficient documentation

## 2024-01-26 DIAGNOSIS — Z0000 Encounter for general adult medical examination without abnormal findings: Secondary | ICD-10-CM

## 2024-01-26 DIAGNOSIS — Z23 Encounter for immunization: Secondary | ICD-10-CM | POA: Diagnosis not present

## 2024-01-26 DIAGNOSIS — Z6830 Body mass index (BMI) 30.0-30.9, adult: Secondary | ICD-10-CM

## 2024-01-26 LAB — TSH: TSH: 1.57 u[IU]/mL (ref 0.35–5.50)

## 2024-01-26 LAB — CBC
HCT: 39.6 % (ref 36.0–46.0)
Hemoglobin: 13 g/dL (ref 12.0–15.0)
MCHC: 32.9 g/dL (ref 30.0–36.0)
MCV: 82.9 fl (ref 78.0–100.0)
Platelets: 342 K/uL (ref 150.0–400.0)
RBC: 4.78 Mil/uL (ref 3.87–5.11)
RDW: 13.5 % (ref 11.5–15.5)
WBC: 5.7 K/uL (ref 4.0–10.5)

## 2024-01-26 LAB — LIPID PANEL
Cholesterol: 252 mg/dL — ABNORMAL HIGH (ref 0–200)
HDL: 41.3 mg/dL (ref >39.00)
LDL Cholesterol: 172 mg/dL — ABNORMAL HIGH (ref 0–99)
NonHDL: 210.84
Total CHOL/HDL Ratio: 6
Triglycerides: 196 mg/dL — ABNORMAL HIGH (ref 0.0–149.0)
VLDL: 39.2 mg/dL (ref 0.0–40.0)

## 2024-01-26 LAB — COMPREHENSIVE METABOLIC PANEL WITH GFR
ALT: 16 U/L (ref 0–35)
AST: 18 U/L (ref 0–37)
Albumin: 4.5 g/dL (ref 3.5–5.2)
Alkaline Phosphatase: 53 U/L (ref 39–117)
BUN: 8 mg/dL (ref 6–23)
CO2: 30 meq/L (ref 19–32)
Calcium: 9.8 mg/dL (ref 8.4–10.5)
Chloride: 102 meq/L (ref 96–112)
Creatinine, Ser: 0.91 mg/dL (ref 0.40–1.20)
GFR: 73.1 mL/min (ref >60.00)
Glucose, Bld: 96 mg/dL (ref 70–99)
Potassium: 3.7 meq/L (ref 3.5–5.1)
Sodium: 139 meq/L (ref 135–145)
Total Bilirubin: 0.3 mg/dL (ref 0.2–1.2)
Total Protein: 8 g/dL (ref 6.0–8.3)

## 2024-01-26 LAB — VITAMIN B12: Vitamin B-12: 442 pg/mL (ref 211–911)

## 2024-01-26 LAB — CORTISOL: Cortisol, Plasma: 2.6 ug/dL

## 2024-01-26 LAB — HEMOGLOBIN A1C: Hgb A1c MFr Bld: 6.5 % (ref 4.6–6.5)

## 2024-01-26 LAB — VITAMIN D 25 HYDROXY (VIT D DEFICIENCY, FRACTURES): VITD: 18.38 ng/mL — ABNORMAL LOW (ref 30.00–100.00)

## 2024-01-26 NOTE — Assessment & Plan Note (Signed)
 Flu shot complete for season. Pneumonia given. Shingrix counseled. Tetanus up to date. Colonoscopy up to date. Mammogram up to date, pap smear up to date. Counseled about sun safety and mole surveillance. Counseled about the dangers of distracted driving. Given 10 year screening recommendations.

## 2024-01-26 NOTE — Assessment & Plan Note (Signed)
 Checking vitamin D , B12, cortisol, HgA1c, lipid panel to assess plan for weight loss. Discussed diet and exercise strategies during visit.

## 2024-01-26 NOTE — Progress Notes (Signed)
   Subjective:   Patient ID: Shirley Wallace, female    DOB: Mar 09, 1971, 53 y.o.   MRN: [id number]  The patient is here for physical. Pertinent topics discussed: Discussed the use of AI scribe software for clinical note transcription with the patient, who gave verbal consent to proceed.  History of Present Illness Shirley Wallace is a 53 year old female who presents with foot pain and weight gain.  She experiences intermittent foot pain described as 'very painful' that occurs when getting out of bed or after sitting for prolonged periods. The pain impacts her ability to walk, often starting during or after walking, which limits her physical activity.  She reports gaining 'unnecessary weight' despite practicing intermittent fasting, eating only once a day, and maintaining a consistent level of physical activity by walking at work. She wonders if her cortisol or thyroid  levels could be contributing to her symptoms.  No new chest pain, tightness, or breathing problems. She reports occasional constipation, which she attributes to her diet, specifically mentioning the consumption of oranges. No diarrhea or heartburn. She is up to date on her colonoscopy and mammogram, has received her flu shot, and her tetanus vaccination is current.  PMH, [facility], social history reviewed and updated  Review of Systems  Constitutional: Negative.   HENT: Negative.    Eyes: Negative.   Respiratory:  Negative for cough, chest tightness and shortness of breath.   Cardiovascular:  Negative for chest pain, palpitations and leg swelling.  Gastrointestinal:  Negative for abdominal distention, abdominal pain, constipation, diarrhea, nausea and vomiting.  Musculoskeletal: Negative.   Skin: Negative.   Neurological: Negative.   Psychiatric/Behavioral: Negative.      Objective:  Physical Exam Constitutional:      Appearance: She is well-developed.  HENT:     Head: Normocephalic and atraumatic.  Cardiovascular:     Rate and  Rhythm: Normal rate and regular rhythm.  Pulmonary:     Effort: Pulmonary effort is normal. No respiratory distress.     Breath sounds: Normal breath sounds. No wheezing or rales.  Abdominal:     General: Bowel sounds are normal. There is no distension.     Palpations: Abdomen is soft.     Tenderness: There is no abdominal tenderness.  Musculoskeletal:     Cervical back: Normal range of motion.  Skin:    General: Skin is warm and dry.  Neurological:     Mental Status: She is alert and oriented to person, place, and time.     Coordination: Coordination normal.     Vitals:   01/26/24 1149  BP: 126/88  Pulse: 70  Temp: 98.2 F (36.8 C)  TempSrc: Oral  SpO2: 99%  Weight: 171 lb (77.6 kg)  Height: 5' 2.5 (1.588 m)    Assessment & Plan:  Prevnar 20 given at visit

## 2024-01-29 ENCOUNTER — Ambulatory Visit: Payer: Self-pay | Admitting: Internal Medicine

## 2024-02-01 NOTE — Progress Notes (Deleted)
 Shirley Wallace [address] [place] [address] Phone: [phone] Subjective:    I'm seeing this patient by the request  of:  Rollene Almarie LABOR, MD  CC:   YEP:Dlagzrupcz  08/01/2023 Actually no sign of any type of infectious etiology but I do think it is more of a splinter. Discussed with patient about icing regimen and home exercises, discussed which activities to do and which ones to avoid. Increase activity slowly. Follow-up again in 6 to 8 weeks. Discussed over-the-counter topical Wallace that would be helpful.   Update 02/02/2024 Shirley Wallace is a 53 y.o. female coming in with complaint of R foot pain. Patient states        Past Medical History:  Diagnosis Date   Allergy     Arthritis    Chronic headaches    Past Surgical History:  Procedure Laterality Date   CESAREAN SECTION     MYOMECTOMY     Social History   Socioeconomic History   Marital status: Married    Spouse name: Not on file   Number of children: 2   Years of education: Not on file   Highest education level: Not on file  Occupational History   Occupation: lab tech  Tobacco Use   Smoking status: Never   Smokeless tobacco: Never  Substance and Sexual Activity   Alcohol use: Yes    Alcohol/week: 2.0 standard drinks of alcohol    Types: 2 Shots of liquor per week    Comment: occasionally   Drug use: No   Sexual activity: Yes  Other Topics Concern   Not on file  Social History Narrative   Not on file   Social Drivers of Health   Financial Resource Strain: Not on file  Food Insecurity: Not on file  Transportation Needs: Not on file  Physical Activity: Not on file  Stress: Not on file  Social Connections: Not on file   Allergies  Allergen Reactions   Aspirin Swelling    Eye swelling   Family History  Problem Relation Age of Onset   Arthritis Mother    Allergies Father    Allergies Paternal Grandfather     Current Outpatient Medications  (Endocrine & Metabolic):    Norgestimate -Ethinyl Estradiol  Triphasic (ORTHO TRI-CYCLEN LO) 0.18/0.215/0.25 MG-25 MCG tab, Take 1 tablet by mouth daily. (Patient not taking: Reported on 01/26/2024)   Current Outpatient Medications (Respiratory):    cetirizine  (ZYRTEC ) 10 MG tablet, Take 1 tablet (10 mg total) by mouth daily.   diphenhydrAMINE (BENADRYL) 25 MG tablet, Take 25 mg by mouth every 6 (six) hours as needed.    Current Outpatient Medications (Other):    Blood Pressure Monitoring (OMRON 3 SERIES BP MONITOR) DEVI, Use as directed   Blood Pressure Monitoring (OMRON 3 SERIES BP MONITOR) DEVI, Use as directed   Cholecalciferol  (VITAMIN D3) 125 MCG (5000 UT) CAPS, Take 1 capsule (5,000 Units total) by mouth daily.   Nutritional Supplements (JUICE PLUS FIBRE PO), Take by mouth. (Patient not taking: Reported on 01/26/2024)   nystatin -triamcinolone  (MYCOLOG II) cream, Apply 1 Application topically 2 (two) times daily. (Patient not taking: Reported on 01/26/2024)   Prenat w/o A-FeCbn-DSS-FA-DHA (PRENAISSANCE PLUS) 28-1-250 MG CAPS,    triamcinolone  ointment (KENALOG ) 0.5 %, Apply 1 application topically 2 (two) times daily. (Patient not taking: Reported on 01/26/2024)   Reviewed prior external information including notes and imaging from  primary care provider As well as notes that were available from care everywhere and  other healthcare systems.  Past medical history, social, surgical and family history all reviewed in electronic medical record.  No pertanent information unless stated regarding to the chief complaint.   Review of Systems:  No headache, visual changes, nausea, vomiting, diarrhea, constipation, dizziness, abdominal pain, skin rash, fevers, chills, night sweats, weight loss, swollen lymph nodes, body aches, joint swelling, chest pain, shortness of breath, mood changes. POSITIVE muscle aches  Objective  There were no vitals taken for this visit.   General: No apparent distress  alert and oriented x3 mood and affect normal, dressed appropriately.  HEENT: Pupils equal, extraocular movements intact  Respiratory: Patient's speak in full sentences and does not appear short of breath  Cardiovascular: No lower extremity edema, non tender, no erythema      Impression and Recommendations:

## 2024-02-02 ENCOUNTER — Ambulatory Visit: Admitting: Family Medicine

## 2024-02-05 ENCOUNTER — Ambulatory Visit: Admitting: Family Medicine

## 2024-02-12 ENCOUNTER — Ambulatory Visit

## 2024-02-16 ENCOUNTER — Telehealth: Payer: Self-pay

## 2024-02-16 NOTE — Telephone Encounter (Signed)
 Copied from CRM [id number]. Topic: Appointments - Appointment Scheduling >> Feb 15, 2024 12:00 PM Franky GRADE wrote: Patient/patient representative is calling to schedule an appointment. Refer to attachments for appointment information. Patient scheduled her 3 month follow up on December 16,2025 and she would like to know if Dr.Crawford wanted her to repeat any labs before her appointment.

## 2024-02-16 NOTE — Telephone Encounter (Signed)
 Are we going to do A1C to see how her sugars are? As she has already had her physical? Please advise

## 2024-02-19 NOTE — Telephone Encounter (Signed)
 We need to wait at least 3 months for HgA1c so her 12/16 visit will likely need to be moved to 12/26 or later so we can check HgA1c and this is done during visit so no prior labs today

## 2024-02-21 NOTE — Telephone Encounter (Signed)
 Pt has been rescheduled to a further out date

## 2024-03-25 ENCOUNTER — Ambulatory Visit

## 2024-04-01 ENCOUNTER — Ambulatory Visit
Admission: RE | Admit: 2024-04-01 | Discharge: 2024-04-01 | Disposition: A | Source: Ambulatory Visit | Attending: Internal Medicine | Admitting: Internal Medicine

## 2024-04-01 DIAGNOSIS — Z1231 Encounter for screening mammogram for malignant neoplasm of breast: Secondary | ICD-10-CM

## 2024-04-05 ENCOUNTER — Ambulatory Visit: Payer: Self-pay | Admitting: Internal Medicine

## 2024-04-16 ENCOUNTER — Ambulatory Visit: Admitting: Internal Medicine

## 2024-04-29 ENCOUNTER — Ambulatory Visit: Admitting: Internal Medicine

## 2024-04-30 ENCOUNTER — Encounter: Payer: Self-pay | Admitting: Internal Medicine

## 2024-04-30 ENCOUNTER — Ambulatory Visit: Admitting: Internal Medicine

## 2024-04-30 VITALS — BP 134/80 | HR 61 | Temp 98.0°F | Ht 62.5 in | Wt 164.0 lb

## 2024-04-30 DIAGNOSIS — R7303 Prediabetes: Secondary | ICD-10-CM | POA: Insufficient documentation

## 2024-04-30 DIAGNOSIS — E782 Mixed hyperlipidemia: Secondary | ICD-10-CM | POA: Insufficient documentation

## 2024-04-30 LAB — LIPID PANEL
Cholesterol: 234 mg/dL — ABNORMAL HIGH (ref 28–200)
HDL: 41.1 mg/dL
LDL Cholesterol: 161 mg/dL — ABNORMAL HIGH (ref 10–99)
NonHDL: 192.64
Total CHOL/HDL Ratio: 6
Triglycerides: 157 mg/dL — ABNORMAL HIGH (ref 10.0–149.0)
VLDL: 31.4 mg/dL (ref 0.0–40.0)

## 2024-04-30 LAB — HEMOGLOBIN A1C: Hgb A1c MFr Bld: 6 % (ref 4.6–6.5)

## 2024-04-30 NOTE — Patient Instructions (Addendum)
 We will check the HgA1c today and the cholesterol.

## 2024-04-30 NOTE — Progress Notes (Unsigned)
" ° °  Subjective:   Patient ID: Shirley Wallace, female    DOB: 1970/08/08, 53 y.o.   MRN: [id number]  Discussed the use of AI scribe software for clinical note transcription with the patient, who gave verbal consent to proceed.  History of Present Illness Shirley Wallace is a 53 year old female who presents for follow-up of knee pain and elevated blood sugar levels.  She experiences intermittent knee pain that arises suddenly during activities such as sitting, shopping, or lying down. The pain is described as 'hurting sometimes' and 'coming out of nowhere.' Walking for exercise provides only slight relief. She previously had foot pain, which resolved before a scheduled appointment, but the pain returned a month later.  Review of Systems  Constitutional: Negative.   HENT: Negative.    Eyes: Negative.   Respiratory:  Negative for cough, chest tightness and shortness of breath.   Cardiovascular:  Negative for chest pain, palpitations and leg swelling.  Gastrointestinal:  Negative for abdominal distention, abdominal pain, constipation, diarrhea, nausea and vomiting.  Musculoskeletal: Negative.   Skin: Negative.   Neurological: Negative.   Psychiatric/Behavioral: Negative.      Objective:  Physical Exam Constitutional:      Appearance: She is well-developed.  HENT:     Head: Normocephalic and atraumatic.  Cardiovascular:     Rate and Rhythm: Normal rate and regular rhythm.  Pulmonary:     Effort: Pulmonary effort is normal. No respiratory distress.     Breath sounds: Normal breath sounds. No wheezing or rales.  Abdominal:     General: Bowel sounds are normal. There is no distension.     Palpations: Abdomen is soft.     Tenderness: There is no abdominal tenderness.  Musculoskeletal:     Cervical back: Normal range of motion.  Skin:    General: Skin is warm and dry.  Neurological:     Mental Status: She is alert and oriented to person, place, and time.     Coordination: Coordination  normal.     Vitals:   04/30/24 1113 04/30/24 1118  BP: (!) 142/80 134/80  Pulse: 61   Temp: 98 F (36.7 C)   TempSrc: Oral   SpO2: 99%   Weight: 164 lb (74.4 kg)   Height: 5' 2.5 (1.588 m)    Assessment and Plan Assessment & Plan Prediabetes   Checking HgA1c and adjust as needed. Counseled about dietary changes and exercise to help.  Mixed hyperlipidemia   Previous elevated cholesterol levels. Checked cholesterol levels today.   "

## 2024-05-07 ENCOUNTER — Ambulatory Visit: Payer: Self-pay | Admitting: Internal Medicine

## 2024-05-09 NOTE — Progress Notes (Signed)
 LVM for pt to call office to go over lab results.

## 2024-05-09 NOTE — Telephone Encounter (Signed)
 Copied from CRM #[id number]. Topic: Clinical - Lab/Test Results >> May 09, 2024 10:57 AM Wyona P wrote: Reason for CRM: Pt called in regarding labs    Read as follows: Rollene Almarie LABOR, MD to Shirley Wallace     05/07/24 11:44 AM Sugars are improved but still pre-diabetes. Follow up 6 months. The cholesterol is high and we should consider medicine to help or diet/exercise to help.   Pt advised understanding and said she will prefer diet and exercise. Also scheduled pt for 11/06/24

## 2024-05-29 ENCOUNTER — Emergency Department (HOSPITAL_COMMUNITY)

## 2024-05-29 ENCOUNTER — Emergency Department (HOSPITAL_COMMUNITY)
Admission: EM | Admit: 2024-05-29 | Discharge: 2024-05-29 | Disposition: A | Discharge status: Home / Self Care | Attending: Emergency Medicine | Admitting: Emergency Medicine

## 2024-05-29 DIAGNOSIS — W000XXA Fall on same level due to ice and snow, initial encounter: Secondary | ICD-10-CM | POA: Insufficient documentation

## 2024-05-29 DIAGNOSIS — M25561 Pain in right knee: Secondary | ICD-10-CM | POA: Insufficient documentation

## 2024-05-29 DIAGNOSIS — M25562 Pain in left knee: Secondary | ICD-10-CM

## 2024-05-29 MED ORDER — ACETAMINOPHEN 325 MG PO TABS
650.0000 mg | ORAL_TABLET | Freq: Once | ORAL | Status: AC
  Administered 2024-05-29: 650 mg via ORAL
  Filled 2024-05-29: qty 2

## 2024-05-29 NOTE — ED Provider Triage Note (Signed)
 Emergency Medicine Provider Triage Evaluation Note  Shirley Wallace , a 54 y.o. female  was evaluated in triage.  Pt complains of right knee pain after slip and fall on the ice. No other injuries. Difficulty walking this morning.  Review of Systems  Positive: Knee pain Negative:   Physical Exam  BP 134/87 (BP Location: Right Arm)   Pulse 66   Temp 98.6 F (37 C) (Oral)   Resp 16   LMP  (LMP Unknown)   SpO2 99%  Gen:   Awake, no distress   Resp:  Normal effort  MSK:   Moves extremities without difficulty  Other:  Focal ttp of left medial knee, no stepoff, deformity, normal ROM with pain, no effusion. Dp, pt pulse 2+ in the affected extremity  Medical Decision Making  Medically screening exam initiated at 8:42 AM.  Appropriate orders placed.  Shirley Wallace was informed that the remainder of the evaluation will be completed by another provider, this initial triage assessment does not replace that evaluation, and the importance of remaining in the ED until their evaluation is complete.  Workup initiated in triage    Rosan Sherlean DEL, [place] 05/29/24 [phone]

## 2024-05-29 NOTE — ED Triage Notes (Signed)
 Patient reports slip and fall on ice last night, c/o right knee pain. Denies hitting head/LOC/thinners. Patient is alert and oriented x 4. Airway patent, respirations even and unlabored. Skin normal, warm and dry.

## 2024-05-29 NOTE — Discharge Instructions (Signed)
 Please use Tylenol  or ibuprofen  for pain.  You may use 600 mg ibuprofen  every 6 hours or 1000 mg of Tylenol  every 6 hours.  You may choose to alternate between the 2.  This would be most effective.  Not to exceed 4 g of Tylenol  within 24 hours.  Not to exceed 3200 mg ibuprofen  24 hours.  Recommend rest, ice, compression, elevation.  Wear the knee brace that I provided, keep the leg elevated when you are not walking.  You may want to use the crutches for support but based on your exam today you can bear weight as tolerated.

## 2024-05-29 NOTE — Progress Notes (Signed)
 Orthopedic Tech Progress Note Patient Details:  Shirley Wallace May 13, 1970 [id number]  Ortho Devices Type of Ortho Device: Knee Sleeve, Crutches Ortho Device/Splint Location: right hinged knee brace applied. crutches sized and instructed on use Ortho Device/Splint Interventions: Ordered, Application, Adjustment   Post Interventions Patient Tolerated: Well, Ambulated well Instructions Provided: Adjustment of device, Care of device  Waylan Thom Loving 05/29/2024, 10:45 AM

## 2024-05-29 NOTE — ED Provider Notes (Signed)
 " [facility] AT [facility] Provider Note   CSN: [id number] Arrival date & time: 05/29/24  9191     Patient presents with: Fall and Knee Pain   Shirley Wallace is a 54 y.o. female Pt complains of right knee pain after slip and fall on the ice. No other injuries. Difficulty walking this morning. No numbness, tingling. Nothing for pain prior to arrival.     Fall  Knee Pain      Prior to Admission medications  Medication Sig Start Date End Date Taking? Authorizing Provider  Blood Pressure Monitoring (OMRON 3 SERIES BP MONITOR) DEVI Use as directed 08/04/21     Blood Pressure Monitoring (OMRON 3 SERIES BP MONITOR) DEVI Use as directed 04/01/22     cetirizine  (ZYRTEC ) 10 MG tablet Take 1 tablet (10 mg total) by mouth daily. 01/24/23   Rollene Almarie LABOR, MD  Cholecalciferol  (VITAMIN D3) 125 MCG (5000 UT) CAPS Take 1 capsule (5,000 Units total) by mouth daily. 01/24/23   Rollene Almarie LABOR, MD  diphenhydrAMINE (BENADRYL) 25 MG tablet Take 25 mg by mouth every 6 (six) hours as needed.    [provider]  Norgestimate -Ethinyl Estradiol  Triphasic (ORTHO TRI-CYCLEN LO) 0.18/0.215/0.25 MG-25 MCG tab Take 1 tablet by mouth daily. Patient not taking: Reported on 01/26/2024 01/24/23   Rollene Almarie LABOR, MD  nystatin -triamcinolone  (MYCOLOG II) cream Apply 1 Application topically 2 (two) times daily. Patient not taking: Reported on 01/26/2024 01/24/23   Rollene Almarie LABOR, MD  triamcinolone  ointment (KENALOG ) 0.5 % Apply 1 application topically 2 (two) times daily. Patient not taking: Reported on 01/26/2024 01/31/17   Nche, Roselie Rockford, NP    Allergies: Aspirin    Review of Systems  All other systems reviewed and are negative.   Updated Vital Signs BP 134/87 (BP Location: Right Arm)   Pulse 66   Temp 98.6 F (37 C) (Oral)   Resp 16   LMP  (LMP Unknown)   SpO2 99%   Physical Exam Vitals and nursing note reviewed.  Constitutional:       General: She is not in acute distress.    Appearance: Normal appearance.  HENT:     Head: Normocephalic and atraumatic.  Eyes:     General:        Right eye: No discharge.        Left eye: No discharge.  Cardiovascular:     Rate and Rhythm: Normal rate and regular rhythm.     Pulses: Normal pulses.     Comments: Dp, pt pulses 2+ in the affected right lower extremity Pulmonary:     Effort: Pulmonary effort is normal. No respiratory distress.  Musculoskeletal:        General: No deformity.     Comments:  Focal ttp of right medial knee, no stepoff, deformity, normal ROM with pain, no effusion  Skin:    General: Skin is warm and dry.  Neurological:     Mental Status: She is alert and oriented to person, place, and time.  Psychiatric:        Mood and Affect: Mood normal.        Behavior: Behavior normal.     (all labs ordered are listed, but only abnormal results are displayed) Labs Reviewed - No data to display  EKG: None  Radiology: DG Knee Complete 4 Views Right Result Date: 05/29/2024 EXAM: 4 OR MORE VIEW(S) XRAY OF THE KNEE 05/29/2024 09:43:00 AM COMPARISON: None available. CLINICAL HISTORY: Patient  fell. FINDINGS: BONES AND JOINTS: No acute fracture. No malalignment. No significant joint effusion. No significant degenerative changes. SOFT TISSUES: Unremarkable. IMPRESSION: 1. No evidence of acute traumatic injury. Electronically signed by: Norleen Boxer MD 05/29/2024 10:01 AM EST RP Workstation: [id number]     Procedures   Medications Ordered in the ED  acetaminophen  (TYLENOL ) tablet 650 mg (650 mg Oral Given 05/29/24 9157)                                    Medical Decision Making Amount and/or Complexity of Data Reviewed Radiology: ordered.  Risk OTC drugs.   This patient is a 54 y.o. female who presents to the ED for concern of knee pain after slip and fall.   Differential diagnoses prior to evaluation: Fracture, dislocation, ligament injury, sprain,  strain  Past Medical History / Social History / Additional history: Chart reviewed. Pertinent results include: overall noncontributory  Physical Exam: Physical exam performed. The pertinent findings include: Focal ttp of right medial knee, no stepoff, deformity, normal ROM with pain, no effusion  Dp, pt pulses 2+ in the affected right lower extremity  Some tenderness of the MCL, suspect probable MCL strain  I independently interpreted imaging including plain film radiograph of the right knee which shows no evidence of acute fracture, dislocation,. I agree with the radiologist interpretation.  Medications / Treatment: Encouraged knee brace, crutches, ibuprofen , Tylenol , rest, ice, compression, elevation.  Orthopedic follow-up if no improvement after 1 week.   Disposition: After consideration of the diagnostic results and the patients response to treatment, I feel that patient stable for discharge with plan as above.   emergency department workup does not suggest an emergent condition requiring admission or immediate intervention beyond what has been performed at this time. The plan is: as above. The patient is safe for discharge and has been instructed to return immediately for worsening symptoms, change in symptoms or any other concerns.   Final diagnoses:  Acute pain of left knee    ED Discharge Orders     None          Rosan Sherlean DEL, PA-C 05/29/24 1028    [place], DO 05/29/24 1152  "

## 2024-06-05 NOTE — Progress Notes (Unsigned)
 " Shirley Wallace [facility] [address] [place] [address] Phone: [phone] Subjective:   Shirley Wallace Shirley Wallace, am serving as a scribe for Dr. Arthea Wallace.  I'm seeing this patient by the request  of:  Shirley Wallace LABOR, MD  CC: Right foot pain  YEP:Dlagzrupcz  08/01/2023 Actually no sign of any type of infectious etiology but I do think it is more of a splinter.  Discussed with patient about icing regimen and home exercises, discussed which activities to do and which ones to avoid.  Increase activity slowly.  Follow-up again in 6 to 8 weeks.  Discussed over-the-counter topical medicine that would be helpful.     Updated 06/07/2024 Shirley Wallace is a 54 y.o. female coming in with complaint of foot pain, has had loss of transverse arch previously.  Patient also has had an infection of the foot previously nearly 1 year ago.  Patient states that she is fell on the ice. Having pain over medial aspect of R knee. Given brace from ED which has been helpful.   R foot pain subsided one month ago. Pain over top and bottom of foot of lateral metatarsal bones. Pain was worse when moving and with shoes on.        Past Medical History:  Diagnosis Date   Allergy     Arthritis    Chronic headaches    Past Surgical History:  Procedure Laterality Date   CESAREAN SECTION     MYOMECTOMY     Social History   Socioeconomic History   Marital status: Married    Spouse name: Not on file   Number of children: 2   Years of education: Not on file   Highest education level: Not on file  Occupational History   Occupation: lab tech  Tobacco Use   Smoking status: Never   Smokeless tobacco: Never  Substance and Sexual Activity   Alcohol use: Yes    Alcohol/week: 2.0 standard drinks of alcohol    Types: 2 Shots of liquor per week    Comment: occasionally   Drug use: No   Sexual activity: Yes  Other Topics Concern   Not on file  Social History Narrative   Not on file    Social Drivers of Health   Tobacco Use: Low Risk (04/30/2024)   Patient History    Smoking Tobacco Use: Never    Smokeless Tobacco Use: Never    Passive Exposure: Not on file  Financial Resource Strain: Not on file  Food Insecurity: Not on file  Transportation Needs: Not on file  Physical Activity: Not on file  Stress: Not on file  Social Connections: Not on file  Depression (PHQ2-9): Low Risk (01/26/2024)   Depression (PHQ2-9)    PHQ-2 Score: 0  Alcohol Screen: Not on file  Housing: Not on file  Utilities: Not on file  Health Literacy: Not on file   Allergies[1] Family History  Problem Relation Age of Onset   Arthritis Mother    Allergies Father    Allergies Paternal Grandfather     Current Outpatient Medications (Endocrine & Metabolic):    Norgestimate -Ethinyl Estradiol  Triphasic (ORTHO TRI-CYCLEN LO) 0.18/0.215/0.25 MG-25 MCG tab, Take 1 tablet by mouth daily. (Patient not taking: Reported on 01/26/2024)  Current Outpatient Medications (Respiratory):    cetirizine  (ZYRTEC ) 10 MG tablet, Take 1 tablet (10 mg total) by mouth daily.   diphenhydrAMINE (BENADRYL) 25 MG tablet, Take 25 mg by mouth every 6 (six) hours as needed.  Current Outpatient Medications (Other):    Blood Pressure Monitoring (OMRON 3 SERIES BP MONITOR) DEVI, Use as directed   Blood Pressure Monitoring (OMRON 3 SERIES BP MONITOR) DEVI, Use as directed   Cholecalciferol  (VITAMIN D3) 125 MCG (5000 UT) CAPS, Take 1 capsule (5,000 Units total) by mouth daily.   nystatin -triamcinolone  (MYCOLOG II) cream, Apply 1 Application topically 2 (two) times daily. (Patient not taking: Reported on 01/26/2024)   triamcinolone  ointment (KENALOG ) 0.5 %, Apply 1 application topically 2 (two) times daily. (Patient not taking: Reported on 01/26/2024)   Reviewed prior external information including notes and imaging from  primary care provider As well as notes that were available from care everywhere and other healthcare  systems.  Past medical history, social, surgical and family history all reviewed in electronic medical record.  No pertanent information unless stated regarding to the chief complaint.   Review of Systems:  No headache, visual changes, nausea, vomiting, diarrhea, constipation, dizziness, abdominal pain, skin rash, fevers, chills, night sweats, weight loss, swollen lymph nodes, body aches, joint swelling, chest pain, shortness of breath, mood changes. POSITIVE muscle aches  Objective  There were no vitals taken for this visit.   General: No apparent distress alert and oriented x3 mood and affect normal, dressed appropriately.  HEENT: Pupils equal, extraocular movements intact  Respiratory: Patient's speak in full sentences and does not appear short of breath  Cardiovascular: No lower extremity edema, non tender, no erythema  Foot exam shows  Limited muscular skeletal ultrasound was performed and interpreted by Wallace HUSSAR, M      Impression and Recommendations:    The above documentation has been reviewed and is accurate and complete Hussar CHRISTELLA Claudene, DO        [1]  Allergies Allergen Reactions   Aspirin Swelling    Eye swelling   "

## 2024-06-07 ENCOUNTER — Other Ambulatory Visit: Payer: Self-pay

## 2024-06-07 ENCOUNTER — Other Ambulatory Visit (HOSPITAL_COMMUNITY): Payer: Self-pay

## 2024-06-07 ENCOUNTER — Ambulatory Visit: Admitting: Family Medicine

## 2024-06-07 VITALS — BP 120/82 | HR 82 | Ht 62.5 in | Wt 164.0 lb

## 2024-06-07 DIAGNOSIS — M25561 Pain in right knee: Secondary | ICD-10-CM

## 2024-06-07 DIAGNOSIS — M79671 Pain in right foot: Secondary | ICD-10-CM

## 2024-06-07 MED ORDER — VITAMIN D (ERGOCALCIFEROL) 1.25 MG (50000 UNIT) PO CAPS
50000.0000 [IU] | ORAL_CAPSULE | ORAL | 0 refills | Status: AC
  Filled 2024-06-07: qty 4, 28d supply, fill #0

## 2024-06-07 NOTE — Patient Instructions (Signed)
 Vit D once weekly K2 200mcg  Brace will work well  Xray in 2 weeks Spenco Total Support [facility] See me again in 6 weeks

## 2024-07-22 ENCOUNTER — Ambulatory Visit: Admitting: Family Medicine

## 2024-11-06 ENCOUNTER — Ambulatory Visit: Admitting: Internal Medicine

## 2025-01-27 ENCOUNTER — Encounter: Admitting: Internal Medicine
